# Patient Record
Sex: Female | Born: 1970
Health system: Southern US, Community
[De-identification: ages and names within clinical notes are randomized; demographics above are authoritative.]

## PROBLEM LIST (undated history)

## (undated) DIAGNOSIS — Z5189 Encounter for other specified aftercare: Secondary | ICD-10-CM

## (undated) HISTORY — PX: OTHER SURGICAL HISTORY: SHX169

## (undated) HISTORY — DX: Encounter for other specified aftercare: Z51.89

---

## 1999-03-15 ENCOUNTER — Emergency Department (HOSPITAL_COMMUNITY): Admission: EM | Admit: 1999-03-15 | Discharge: 1999-03-15 | Payer: Self-pay | Admitting: Emergency Medicine

## 2002-10-14 ENCOUNTER — Emergency Department (HOSPITAL_COMMUNITY): Admission: EM | Admit: 2002-10-14 | Discharge: 2002-10-14 | Payer: Self-pay | Admitting: Emergency Medicine

## 2005-03-10 HISTORY — PX: LEEP: SHX91

## 2005-08-12 ENCOUNTER — Ambulatory Visit (HOSPITAL_COMMUNITY): Admission: RE | Admit: 2005-08-12 | Discharge: 2005-08-12 | Payer: Self-pay | Admitting: Obstetrics

## 2008-09-29 ENCOUNTER — Ambulatory Visit (HOSPITAL_COMMUNITY): Admission: RE | Admit: 2008-09-29 | Discharge: 2008-09-29 | Payer: Self-pay | Admitting: Obstetrics

## 2008-09-29 ENCOUNTER — Encounter: Payer: Self-pay | Admitting: Obstetrics

## 2009-10-15 ENCOUNTER — Emergency Department (HOSPITAL_COMMUNITY): Admission: EM | Admit: 2009-10-15 | Discharge: 2009-10-15 | Payer: Self-pay | Admitting: Family Medicine

## 2010-06-16 LAB — CBC
HCT: 39.9 % (ref 36.0–46.0)
Hemoglobin: 13.7 g/dL (ref 12.0–15.0)
MCHC: 34.3 g/dL (ref 30.0–36.0)
MCV: 89.5 fL (ref 78.0–100.0)
Platelets: 312 10*3/uL (ref 150–400)
RBC: 4.46 MIL/uL (ref 3.87–5.11)
RDW: 13.2 % (ref 11.5–15.5)
WBC: 7.6 10*3/uL (ref 4.0–10.5)

## 2010-06-16 LAB — PREGNANCY, URINE: Preg Test, Ur: NEGATIVE

## 2010-07-23 NOTE — Op Note (Signed)
NAMEMARRISSA, [name]                ACCOUNT NO.:  [id number]   MEDICAL RECORD NO.:  [id number]          PATIENT TYPE:  AMB   LOCATION:  SDC                           FACILITY:  [facility]   PHYSICIAN:  Charles A. Clearance Coots, M.D.DATE OF BIRTH:  1970-05-22   DATE OF PROCEDURE:  09/29/2008  DATE OF DISCHARGE:                               OPERATIVE REPORT   PREOPERATIVE DIAGNOSIS:  Cervical intraepithelial neoplasia-II.   POSTOPERATIVE DIAGNOSIS:  Cervical intraepithelial neoplasia-II.   PROCEDURE:  Loop electrosurgical excision procedure.   SURGEON:  Charles A. Clearance Coots, MD   ANESTHESIA:  General with local, Xylocaine and epinephrine.   ESTIMATED BLOOD LOSS:  Minimal.   COMPLICATIONS:  None.   SPECIMEN:  Cervical cone.   DISPOSITION OF SPECIMEN:  Pathology.   OPERATION:  The patient was brought to the operating room and after  satisfactory general endotracheal anesthesia, the legs were brought up  in stirrups and the vagina was prepped and draped in the usual sterile  fashion.  A sterile-coated speculum was inserted in the vaginal vault  and the cervix was isolated.  The local of 20 mL of mixture of 1%  Xylocaine with epinephrine was injected throughout the cervical stroma.  A conization specimen was then obtained with 20 x 12-mm loop in routine  fashion without complications and a wattage of bland of 75 cutting with  50 watts of coag.  The specimen was submitted to Pathology for  evaluation.  The edges of the base of the conization were feathered with  the ball-tip electrode and the base was also cauterized for hemostasis.  There was no active bleeding at the conclusion of the procedure.  All  instruments were retired.  The patient tolerated the procedure well, was  transported to the recovery room in satisfactory condition.       Charles A. Clearance Coots, M.D.  Electronically Signed     CAH/MEDQ  D:  09/29/2008  T:  09/29/2008  Job:  [id number]

## 2015-03-26 ENCOUNTER — Encounter (HOSPITAL_COMMUNITY): Payer: Self-pay | Admitting: Emergency Medicine

## 2015-03-26 ENCOUNTER — Inpatient Hospital Stay (HOSPITAL_COMMUNITY)
Admission: EM | Admit: 2015-03-26 | Discharge: 2015-03-30 | DRG: 871 | Disposition: A | Discharge status: Home / Self Care | Payer: 59 | Attending: Internal Medicine | Admitting: Internal Medicine

## 2015-03-26 ENCOUNTER — Emergency Department (HOSPITAL_COMMUNITY): Payer: 59

## 2015-03-26 DIAGNOSIS — E872 Acidosis: Secondary | ICD-10-CM | POA: Diagnosis present

## 2015-03-26 DIAGNOSIS — J181 Lobar pneumonia, unspecified organism: Secondary | ICD-10-CM

## 2015-03-26 DIAGNOSIS — J13 Pneumonia due to Streptococcus pneumoniae: Secondary | ICD-10-CM | POA: Diagnosis present

## 2015-03-26 DIAGNOSIS — J189 Pneumonia, unspecified organism: Secondary | ICD-10-CM

## 2015-03-26 DIAGNOSIS — F17210 Nicotine dependence, cigarettes, uncomplicated: Secondary | ICD-10-CM | POA: Diagnosis present

## 2015-03-26 DIAGNOSIS — N179 Acute kidney failure, unspecified: Secondary | ICD-10-CM | POA: Diagnosis present

## 2015-03-26 DIAGNOSIS — E871 Hypo-osmolality and hyponatremia: Secondary | ICD-10-CM | POA: Diagnosis present

## 2015-03-26 DIAGNOSIS — J154 Pneumonia due to other streptococci: Secondary | ICD-10-CM | POA: Diagnosis present

## 2015-03-26 DIAGNOSIS — R0602 Shortness of breath: Secondary | ICD-10-CM | POA: Diagnosis present

## 2015-03-26 DIAGNOSIS — E860 Dehydration: Secondary | ICD-10-CM | POA: Diagnosis present

## 2015-03-26 DIAGNOSIS — A419 Sepsis, unspecified organism: Secondary | ICD-10-CM | POA: Diagnosis not present

## 2015-03-26 DIAGNOSIS — A403 Sepsis due to Streptococcus pneumoniae: Secondary | ICD-10-CM | POA: Diagnosis present

## 2015-03-26 DIAGNOSIS — D509 Iron deficiency anemia, unspecified: Secondary | ICD-10-CM | POA: Diagnosis present

## 2015-03-26 LAB — COMPREHENSIVE METABOLIC PANEL
ALBUMIN: 3.4 g/dL — AB (ref 3.5–5.0)
ALK PHOS: 62 U/L (ref 38–126)
ALT: 19 U/L (ref 14–54)
AST: 36 U/L (ref 15–41)
Anion gap: 14 (ref 5–15)
BILIRUBIN TOTAL: 0.9 mg/dL (ref 0.3–1.2)
BUN: 14 mg/dL (ref 6–20)
CALCIUM: 8.5 mg/dL — AB (ref 8.9–10.3)
CO2: 21 mmol/L — ABNORMAL LOW (ref 22–32)
Chloride: 96 mmol/L — ABNORMAL LOW (ref 101–111)
Creatinine, Ser: 1.22 mg/dL — ABNORMAL HIGH (ref 0.44–1.00)
GFR calc Af Amer: >60 mL/min (ref >60)
GFR calc non Af Amer: 53 mL/min — ABNORMAL LOW (ref >60)
GLUCOSE: 177 mg/dL — AB (ref 65–99)
Potassium: 3.9 mmol/L (ref 3.5–5.1)
Sodium: 131 mmol/L — ABNORMAL LOW (ref 135–145)
TOTAL PROTEIN: 8 g/dL (ref 6.5–8.1)

## 2015-03-26 LAB — URINALYSIS, ROUTINE W REFLEX MICROSCOPIC
GLUCOSE, UA: NEGATIVE mg/dL
KETONES UR: NEGATIVE mg/dL
Leukocytes, UA: NEGATIVE
Nitrite: NEGATIVE
Specific Gravity, Urine: 1.024 (ref 1.005–1.030)
pH: 6 (ref 5.0–8.0)

## 2015-03-26 LAB — I-STAT TROPONIN, ED: TROPONIN I, POC: 0.03 ng/mL (ref 0.00–0.08)

## 2015-03-26 LAB — CBC WITH DIFFERENTIAL/PLATELET
BASOS ABS: 0 10*3/uL (ref 0.0–0.1)
Basophils Relative: 0 %
EOS PCT: 0 %
Eosinophils Absolute: 0 10*3/uL (ref 0.0–0.7)
HCT: 30.8 % — ABNORMAL LOW (ref 36.0–46.0)
Hemoglobin: 9.6 g/dL — ABNORMAL LOW (ref 12.0–15.0)
LYMPHS PCT: 8 %
Lymphs Abs: 0.6 10*3/uL — ABNORMAL LOW (ref 0.7–4.0)
MCH: 22.3 pg — ABNORMAL LOW (ref 26.0–34.0)
MCHC: 31.2 g/dL (ref 30.0–36.0)
MCV: 71.6 fL — AB (ref 78.0–100.0)
MONO ABS: 0.4 10*3/uL (ref 0.1–1.0)
Monocytes Relative: 6 %
Neutro Abs: 6 10*3/uL (ref 1.7–7.7)
Neutrophils Relative %: 86 %
PLATELETS: 364 10*3/uL (ref 150–400)
RBC: 4.3 MIL/uL (ref 3.87–5.11)
RDW: 17.9 % — AB (ref 11.5–15.5)
WBC: 7.1 10*3/uL (ref 4.0–10.5)

## 2015-03-26 LAB — I-STAT CG4 LACTIC ACID, ED
Lactic Acid, Venous: 4.05 mmol/L (ref 0.5–2.0)
Lactic Acid, Venous: 4.44 mmol/L (ref 0.5–2.0)

## 2015-03-26 LAB — URINE MICROSCOPIC-ADD ON

## 2015-03-26 MED ORDER — DEXTROSE 5 % IV SOLN
500.0000 mg | Freq: Once | INTRAVENOUS | Status: AC
  Administered 2015-03-26: 500 mg via INTRAVENOUS
  Filled 2015-03-26: qty 500

## 2015-03-26 MED ORDER — KETOROLAC TROMETHAMINE 15 MG/ML IJ SOLN
15.0000 mg | Freq: Once | INTRAMUSCULAR | Status: AC
  Administered 2015-03-26: 15 mg via INTRAVENOUS
  Filled 2015-03-26: qty 1

## 2015-03-26 MED ORDER — SODIUM CHLORIDE 0.9 % IV BOLUS (SEPSIS)
1000.0000 mL | Freq: Once | INTRAVENOUS | Status: AC
  Administered 2015-03-26: 1000 mL via INTRAVENOUS

## 2015-03-26 MED ORDER — GUAIFENESIN 100 MG/5ML PO SOLN
5.0000 mL | Freq: Once | ORAL | Status: AC
  Administered 2015-03-26: 100 mg via ORAL
  Filled 2015-03-26: qty 5

## 2015-03-26 MED ORDER — LEVALBUTEROL HCL 0.63 MG/3ML IN NEBU
0.6300 mg | INHALATION_SOLUTION | RESPIRATORY_TRACT | Status: DC | PRN
  Administered 2015-03-26: 0.63 mg via RESPIRATORY_TRACT
  Filled 2015-03-26: qty 3

## 2015-03-26 MED ORDER — ACETAMINOPHEN 325 MG PO TABS: 650.0000 mg | ORAL_TABLET | Freq: Once | ORAL | Status: AC

## 2015-03-26 MED ORDER — ACETAMINOPHEN 325 MG PO TABS
650.0000 mg | ORAL_TABLET | Freq: Once | ORAL | Status: AC | PRN
  Administered 2015-03-26: 650 mg via ORAL
  Filled 2015-03-26: qty 2

## 2015-03-26 MED ORDER — SODIUM CHLORIDE 0.9 % IV BOLUS (SEPSIS)
1000.0000 mL | INTRAVENOUS | Status: AC
  Administered 2015-03-26 (×2): 1000 mL via INTRAVENOUS

## 2015-03-26 MED ORDER — DEXTROSE 5 % IV SOLN
1.0000 g | Freq: Once | INTRAVENOUS | Status: AC
  Administered 2015-03-26: 1 g via INTRAVENOUS
  Filled 2015-03-26: qty 10

## 2015-03-26 MED ORDER — SODIUM CHLORIDE 0.9 % IV BOLUS (SEPSIS)
500.0000 mL | INTRAVENOUS | Status: AC
  Administered 2015-03-26: 500 mL via INTRAVENOUS

## 2015-03-26 NOTE — ED Notes (Signed)
Bed: WTR7 Expected date:  Expected time:  Means of arrival:  Comments: LAB 

## 2015-03-26 NOTE — ED Notes (Signed)
Phlebotomy made aware of lab draw.

## 2015-03-26 NOTE — ED Notes (Signed)
Bed: [id number] Expected date:  Expected time:  Means of arrival:  Comments: Tr 6

## 2015-03-26 NOTE — ED Notes (Signed)
Penny AlvineGoldston, MD made aware of blood pressure.

## 2015-03-26 NOTE — Consult Note (Signed)
Name: Penny Alimentnissa L Behler MRN: [id number] DOB: 07/20/1970    ADMISSION DATE:  03/26/2015 CONSULTATION DATE:  03/26/15  REFERRING MD :  Elisabeth Pigeonevine  CHIEF COMPLAINT:  SOB  BRIEF PATIENT DESCRIPTION: 45 y.o. female without any significant PMH, [facility].  PCCM called due to increase in lactate from 4 to 4.4.  SIGNIFICANT EVENTS  01/16 > admitted with CAP.  STUDIES:  CXR 01/16 > bilateral opacities c/w PNA.   HISTORY OF PRESENT ILLNESS:  Penny Schultz is a 45 y.o. female with no significant PMH and who takes no chronic medications.  [facility] with SOB, weakness, fevers/chills, intermittent cough productive of clear sputum.  He had not had any chest pain, N/V/D, abd pain, myalgias, lightheadedness, recent travel, exposures to known sick contacts.  In ED, CXR revealed bilateral opacities concerning for PNA.  She was started on CAP coverage and [facility]  Following admission, lactate had increased from 4 to 4.4; therefore, PCCM was consulted.  She is feeling a little better but remains tachypneic. She continue to have cough.  Her lactic acid has improved.  PAST MEDICAL HISTORY :   has no past medical history on file.  has past surgical history that includes open heart surgery 45 years old. Prior to Admission medications   Medication Sig Start Date End Date Taking? Authorizing Provider  Acetaminophen-Guaifenesin (THERAFLU FLU/CHEST CONGESTION) 1000-400 MG PACK Take 1 packet by mouth once.   Yes Historical Provider, MD  Aspirin-Salicylamide-Caffeine (BC HEADACHE PO) Take 2 packets by mouth every 3 (three) hours as needed (for pain).   Yes Historical Provider, MD  Phenyleph-Doxylamine-DM-APAP (ALKA SELTZER PLUS PO) Take 2 tablets by mouth every 4 (four) hours as needed (for cold).   Yes Historical Provider, MD   No Known Allergies  FAMILY HISTORY:  family history is not on file. SOCIAL HISTORY:  reports that she has been smoking Cigarettes.  She  has been smoking about 1.00 pack per day. She does not have any smokeless tobacco history on file. She reports that she uses illicit drugs (Marijuana). She reports that she does not drink alcohol.  REVIEW OF SYSTEMS:   Gen: + fever, + chills, no weight change, fatigue, night sweats HEENT: Denies blurred vision, double vision, hearing loss, tinnitus, sinus congestion, rhinorrhea, sore throat, neck stiffness, dysphagia PULM: per HPI CV: Denies chest pain, edema, orthopnea, paroxysmal nocturnal dyspnea, palpitations GI: Denies abdominal pain, nausea, vomiting, diarrhea, hematochezia, melena, constipation, change in bowel habits GU: Denies dysuria, hematuria, polyuria, oliguria, urethral discharge Endocrine: Denies hot or cold intolerance, polyuria, polyphagia or appetite change Derm: Denies rash, dry skin, scaling or peeling skin change Heme: Denies easy bruising, bleeding, bleeding gums Neuro: Denies headache, numbness, weakness, slurred speech, loss of memory or consciousness    SUBJECTIVE:   VITAL SIGNS: Temp:  [99 F (37.2 C)-103.2 F (39.6 C)] 99.6 F (37.6 C) (01/16 1919) Pulse Rate:  [100-150] 100 (01/16 2100) Resp:  [19-35] 35 (01/16 2100) BP: (90-144)/(63-87) 102/82 mmHg (01/16 2100) SpO2:  [93 %-98 %] 95 % (01/16 2100) Weight:  [73.483 kg (162 lb)] 73.483 kg (162 lb) (01/16 1325)  PHYSICAL EXAMINATION: General: Tachypneic, talking in full sentences Neuro: Awake, alert, no distress HEENT: NCAT, OP clear  Cardiovascular: Tachy, regular, no mgr, radial pulses intact Lungs: Crackles R lung bases and mid lung fields, clear on left Abdomen: BS+, soft, nontender Musculoskeletal: Normal bulk and tone Skin: warm, well perfused, no mottling, normal cap refill  Recent Labs Lab 03/26/15 1346  NA 131*  K 3.9  CL 96*  CO2 21*  BUN 14  CREATININE 1.22*  GLUCOSE 177*    Recent Labs Lab 03/26/15 1346  HGB 9.6*  HCT 30.8*  WBC 7.1  PLT 364   Dg Chest 2  View  03/26/2015  CLINICAL DATA:  Productive cough, chest pain and fever for 1 week. Initial encounter. EXAM: CHEST  2 VIEW COMPARISON:  None. FINDINGS: Extensive airspace opacity is present in the right upper, right middle and left lower lobes. Heart size is normal. No pneumothorax or pleural effusion. IMPRESSION: Bilateral airspace disease most consistent with pneumonia. Recommend plain film follow-up to clearing. Electronically Signed   By: Drusilla Kanner M.D.   On: 03/26/2015 13:52    ASSESSMENT / PLAN:  Sepsis - presumed due to CAP;  qSOFA score 1. Lactic acid now clearing which is reassuring.  Has been given ceftriaxone and azithromycin which is appropriate. Plan: Continue empiric abx Follow cultures and PCT algorithm Continue BD's Pulmonary hygiene Will need repeat CXR to ensure resolution CXR in AM  AKI Mild hyponatremia Plan: Continue gentle hydration BMP in AM  Dropping lactic acid is reassuring and the fact that she feels better is reassuring, however while she may no longer need ICU level care I would still work to admit her to at least telemetry for closer monitoring.  PCCM will sign off  Heber Hale, MD [place] PCCM Pager: [phone] Cell: [phone] After 3pm or if no response, call [phone]

## 2015-03-26 NOTE — ED Notes (Signed)
Pt reports cough/chest pain for a week; febrile on arrival.

## 2015-03-26 NOTE — ED Notes (Signed)
Consulting civil engineerCharge RN working with [facility] [employer] for type of bed needed.

## 2015-03-26 NOTE — ED Provider Notes (Signed)
CSN: [id number]     Arrival date & time 03/26/15  1318 History   First MD Initiated Contact with Patient 03/26/15 1410     Chief Complaint  Patient presents with  . Fever  . Shortness of Breath     (Consider location/radiation/quality/duration/timing/severity/associated sxs/prior Treatment) HPI   Penny Schultz is a 45 y.o. female was been ill for several days with fever, chills, cough, achiness and feeling weak. She denies headache, back pain, dizziness, nausea, vomiting, dysuria, diarrhea or constipation. There are no other known modifying factors.   History reviewed. No pertinent past medical history. Past Surgical History  Procedure Laterality Date  . Open heart surgery 45 years old     No family history on file. Social History  Substance Use Topics  . Smoking status: Current Every Day Smoker -- 1.00 packs/day    Types: Cigarettes  . Smokeless tobacco: None  . Alcohol Use: No   OB History    No data available     Review of Systems  All other systems reviewed and are negative.     Allergies  Review of patient's allergies indicates no known allergies.  Home Medications   Prior to Admission medications   Medication Sig Start Date End Date Taking? Authorizing Provider  Acetaminophen-Guaifenesin (THERAFLU FLU/CHEST CONGESTION) 1000-400 MG PACK Take 1 packet by mouth once.   Yes Historical Provider, MD  Aspirin-Salicylamide-Caffeine (BC HEADACHE PO) Take 2 packets by mouth every 3 (three) hours as needed (for pain).   Yes Historical Provider, MD  Phenyleph-Doxylamine-DM-APAP (ALKA SELTZER PLUS PO) Take 2 tablets by mouth every 4 (four) hours as needed (for cold).   Yes Historical Provider, MD   BP 108/70 mmHg  Pulse 108  Temp(Src) 99.8 F (37.7 C) (Oral)  Resp 23  Ht 5\' 8"  (1.727 m)  Wt 162 lb (73.483 kg)  BMI 24.64 kg/m2  SpO2 95%  LMP 03/21/2015 Physical Exam  Constitutional: She is oriented to person, place, and time. She appears well-developed and  well-nourished. No distress.  HENT:  Head: Normocephalic and atraumatic.  Right Ear: External ear normal.  Left Ear: External ear normal.  Eyes: Conjunctivae and EOM are normal. Pupils are equal, round, and reactive to light.  Neck: Normal range of motion and phonation normal. Neck supple.  Cardiovascular: Normal rate, regular rhythm and normal heart sounds.   Capillary refill less than 2 seconds, skin turgor normal.  Pulmonary/Chest: Effort normal. No respiratory distress. She has no wheezes. She exhibits no tenderness and no bony tenderness.  Few scattered rhonchi  Abdominal: Soft. There is no tenderness.  Musculoskeletal: Normal range of motion.  Neurological: She is alert and oriented to person, place, and time. No cranial nerve deficit or sensory deficit. She exhibits normal muscle tone. Coordination normal.  Skin: Skin is warm, dry and intact.  Psychiatric: She has a normal mood and affect. Her behavior is normal. Judgment and thought content normal.  Nursing note and vitals reviewed.   ED Course  Procedures (including critical care time)  Medications  sodium chloride 0.9 % bolus 1,000 mL (1,000 mLs Intravenous New Bag/Given 03/26/15 1418)    Followed by  sodium chloride 0.9 % bolus 500 mL (not administered)  azithromycin (ZITHROMAX) 500 mg in dextrose 5 % 250 mL IVPB (500 mg Intravenous New Bag/Given 03/26/15 1536)  acetaminophen (TYLENOL) tablet 650 mg (650 mg Oral Given 03/26/15 1336)  acetaminophen (TYLENOL) tablet 650 mg (0 mg Oral Duplicate 03/26/15 1512)  cefTRIAXone (ROCEPHIN) 1 g in dextrose 5 %  50 mL IVPB (1 g Intravenous New Bag/Given 03/26/15 1445)    Patient Vitals for the past 24 hrs:  BP Temp Temp src Pulse Resp SpO2 Height Weight  03/26/15 1531 108/70 mmHg 99.8 F (37.7 C) Oral 108 23 95 % - -  03/26/15 1500 106/63 mmHg - - 111 23 95 % - -  03/26/15 1415 117/67 mmHg 102.6 F (39.2 C) Oral (!) 133 20 98 % - -  03/26/15 1325 110/87 mmHg (!) 103.2 F (39.6 C)  Oral (!) 150 24 94 % 5\' 8"  (1.727 m) 162 lb (73.483 kg)    4:10 PM Reevaluation with update and discussion. After initial assessment and treatment, an updated evaluation reveals she is comfortable now. She denies headache or weakness at this time. Lynix Bonine L   15:44- consultation requested for intensivist, query admit. Dr. Vassie LollAlva recommends admit to Hospitalist and consult with them prn. 16:25   Care to Dr. Criss AlvineGoldston to disposition after 2nd Lactate  CRITICAL CARE Performed by: Mancel BaleWENTZ,Lakendria Nicastro L Total critical care time: 35  minutes Critical care time was exclusive of separately billable procedures and treating other patients. Critical care was necessary to treat or prevent imminent or life-threatening deterioration. Critical care was time spent personally by me on the following activities: development of treatment plan with patient and/or surrogate as well as nursing, discussions with consultants, evaluation of patient's response to treatment, examination of patient, obtaining history from patient or surrogate, ordering and performing treatments and interventions, ordering and review of laboratory studies, ordering and review of radiographic studies, pulse oximetry and re-evaluation of patient's condition.    Labs Review Labs Reviewed  COMPREHENSIVE METABOLIC PANEL - Abnormal; Notable for the following:    Sodium 131 (*)    Chloride 96 (*)    CO2 21 (*)    Glucose, Bld 177 (*)    Creatinine, Ser 1.22 (*)    Calcium 8.5 (*)    Albumin 3.4 (*)    GFR calc non Af Amer 53 (*)    All other components within normal limits  URINALYSIS, ROUTINE W REFLEX MICROSCOPIC (NOT AT [facility]) - Abnormal; Notable for the following:    Color, Urine AMBER (*)    APPearance CLOUDY (*)    Hgb urine dipstick LARGE (*)    Bilirubin Urine SMALL (*)    Protein, ur >300 (*)    All other components within normal limits  CBC WITH DIFFERENTIAL/PLATELET - Abnormal; Notable for the following:    Hemoglobin 9.6  (*)    HCT 30.8 (*)    MCV 71.6 (*)    MCH 22.3 (*)    RDW 17.9 (*)    Lymphs Abs 0.6 (*)    All other components within normal limits  URINE MICROSCOPIC-ADD ON - Abnormal; Notable for the following:    Squamous Epithelial / LPF 6-30 (*)    Bacteria, UA RARE (*)    All other components within normal limits  I-STAT CG4 LACTIC ACID, ED - Abnormal; Notable for the following:    Lactic Acid, Venous 4.05 (*)    All other components within normal limits  CULTURE, BLOOD (ROUTINE X 2)  CULTURE, BLOOD (ROUTINE X 2)  URINE CULTURE  I-STAT TROPOININ, ED  I-STAT CG4 LACTIC ACID, ED    Imaging Review Dg Chest 2 View  03/26/2015  CLINICAL DATA:  Productive cough, chest pain and fever for 1 week. Initial encounter. EXAM: CHEST  2 VIEW COMPARISON:  None. FINDINGS: Extensive airspace opacity is present in the right  upper, right middle and left lower lobes. Heart size is normal. No pneumothorax or pleural effusion. IMPRESSION: Bilateral airspace disease most consistent with pneumonia. Recommend plain film follow-up to clearing. Electronically Signed   By: Drusilla Kanner M.D.   On: 03/26/2015 13:52   I have personally reviewed and evaluated these images and lab results as part of my medical decision-making.   EKG Interpretation   Date/Time:  Monday March 26 2015 13:32:18 EST Ventricular Rate:  149 PR Interval:  108 QRS Duration: 67 QT Interval:  260 QTC Calculation: 409 R Axis:   36 Text Interpretation:  Sinus tachycardia Borderline ST depression, diffuse  leads Baseline wander in lead(s) II III aVF V3 V4 No old tracing to  compare Confirmed by [facility]  MD, Keion Neels [phone]) on 03/26/2015 4:03:13 PM      MDM   Final diagnoses:  Lobar pneumonia (HCC)     Community-acquired pneumonia, without clinical or vital sign abnormalities consistent with severe sepsis. Initial lactate high at 4. Incidental hyperglycemia, and likely mild dehydration. Patient has been treated with community-acquired  pneumonia. Empiric therapy, and high value saline bolus, 30 cc/kg.  Nursing Notes Reviewed/ Care Coordinated, and agree without changes. Applicable Imaging Reviewed.  Interpretation of Laboratory Data incorporated into ED treatment  Plan: Admit   Mancel Bale, MD 03/27/15 0710

## 2015-03-26 NOTE — Progress Notes (Signed)
[facility] ED CM noted pt with coverage but no pcp listed Spoke with pt who confirms no pcp  [facility] ED CM spoke with pt on how to obtain an in network pcp with insurance coverage via the customer service number or web site  Cm reviewed ED level of care for crisis/emergent services and community pcp level of care to manage continuous or chronic medical concerns.  The pt voiced understanding CM encouraged pt and discussed pt's responsibility to verify with pt's insurance carrier that any recommended medical provider offered by any emergency room or a hospital provider is within the carrier's network. The pt voiced understanding      

## 2015-03-26 NOTE — ED Notes (Signed)
Pt transferred to DG at present time.

## 2015-03-26 NOTE — ED Notes (Signed)
Phebotomy made aware of blood draw. Patient has fluid bolus running in IV; open all the way.

## 2015-03-26 NOTE — ED Notes (Signed)
Attempted blood draw; unsuccessful. Phlebotomy made aware.

## 2015-03-26 NOTE — ED Provider Notes (Signed)
PA for [facility] is requesting patient be changed to telemetry. Will update bed request.  Pricilla LovelessScott Candice Lunney, MD 03/26/15 2222

## 2015-03-26 NOTE — H&P (Signed)
[facility] History and Physical  Penny Schultz JJK:[id number] DOB: 18-May-1970 DOA: 03/26/2015  Referring physician: ER physician: Dr. Christ Kick PCP: No primary care provider on file. will set up with [facility] on discharge   Chief Complaint: "not feeling well"  HPI:  45 y.o. female with no significant past medical history, does not take any meds who presented to [facility] ED with reports of worsening shortness of breath, weakness, fevers, chills, intermittent productive cough of clear sputum. No reports of chest tightness. Shortness of breath is present at rest and with exertion. No abdominal pain, no nausea or vomiting. No diarrhea or constipation. No fall. No lightheadedness.   In ED, pt was hemodynamically stable. BP was 90/63, RR 19-25, HR 106-150, T max 103.2 F, oxygen saturation 94% with [place] oxygen support. Blood work showed hemoglobin of 9.6, creatinine 1.22, lactic acid 4.05. CXR showed bilateral airspace opacities likely pneumonia. She was started on azithro and rocephin. She was admitted to SDU.   Assessment & Plan    Principal Problem:   Sepsis due to pneumonia ([place]) - Sepsis criteria met on admission with fever, tachycardia, tachypnea, lactic acidosis - Suspected source of infection is bilateral pneumonia - Sepsis and pneumonia order set placed - Staretd on azithromycin and rocephin  - Follow up blood cultures, resp culture, legionella, strep pneumonia - Follow up repeat lactic acid level, procalcitonin - Admission to SDU  Active Problems:   Acute renal failure (ARF) ([place]) - Due to sepsis, dehydration - Continue IV fluids - Follow up BMP in am    Microcytic anemia - Check iron panel, ferritin   DVT prophylaxis:  - SCD's bilaterally   Radiological Exams on Admission: Dg Chest 2 View 03/26/2015  Bilateral airspace disease most consistent with pneumonia. Recommend plain film follow-up to clearing. Electronically Signed   By: Inge Rise M.D.   On: 03/26/2015 13:52     EKG: I have personally reviewed EKG. EKG shows sinus tachycardia   Code Status: Full Family Communication: Plan of care discussed with the patient  Disposition Plan: Admit for further evaluation  Leisa Lenz, MD  [facility] Pager [phone]  Time spent in minutes: 75 minutes  Review of Systems:  Constitutional: Positive for fever, chills and malaise/fatigue. Positive for diaphoresis.  HENT: Negative for hearing loss, ear pain, nosebleeds, congestion, sore throat, neck pain, tinnitus and ear discharge.   Eyes: Negative for blurred vision, double vision, photophobia, pain, discharge and redness.  Respiratory: per HPI.   Cardiovascular: Negative for chest pain, palpitations, orthopnea, claudication and leg swelling.  Gastrointestinal: Negative for nausea, vomiting and abdominal pain. Negative for heartburn, constipation, blood in stool and melena.  Genitourinary: Negative for dysuria, urgency, frequency, hematuria and flank pain.  Musculoskeletal: Negative for myalgias, back pain, joint pain and falls.  Skin: Negative for itching and rash.  Neurological: Negative for dizziness and weakness. Negative for tingling, tremors, sensory change, speech change, focal weakness, loss of consciousness and headaches.  Endo/Heme/Allergies: Negative for environmental allergies and polydipsia. Does not bruise/bleed easily.  Psychiatric/Behavioral: Negative for suicidal ideas. The patient is not nervous/anxious.      History reviewed. No pertinent past medical history. Past Surgical History  Procedure Laterality Date  . Open heart surgery 45 years old     Social History:  reports that she has been smoking Cigarettes.  She has been smoking about 1.00 pack per day. She does not have any smokeless tobacco history on file. She reports that she uses illicit drugs (Marijuana). She reports  that she does not drink alcohol.  No Known Allergies  Family History: hypertension in mother    Prior to  Admission medications   Medication Sig Start Date End Date Taking? Authorizing Provider  Acetaminophen-Guaifenesin (THERAFLU FLU/CHEST CONGESTION) 1000-400 MG PACK Take 1 packet by mouth once.   Yes Historical Provider, MD  Aspirin-Salicylamide-Caffeine (BC HEADACHE PO) Take 2 packets by mouth every 3 (three) hours as needed (for pain).   Yes Historical Provider, MD  Phenyleph-Doxylamine-DM-APAP (ALKA SELTZER PLUS PO) Take 2 tablets by mouth every 4 (four) hours as needed (for cold).   Yes Historical Provider, MD   Physical Exam: Filed Vitals:   03/26/15 1721 03/26/15 1752 03/26/15 1801 03/26/15 1804  BP: 144/73 90/63 125/65 110/69  Pulse: 123 109    Temp:      TempSrc:      Resp: 23 25    Height:      Weight:      SpO2: 95% 96%      Physical Exam  Constitutional: Appears well-developed and well-nourished. No distress.  HENT: Normocephalic. No tonsillar erythema or exudates Eyes: Conjunctivae are normal. No scleral icterus.  Neck: Normal ROM. Neck supple. No JVD. No tracheal deviation. No thyromegaly.  CVS: RRR, S1/S2 +, no murmurs, no gallops, no carotid bruit.  Pulmonary: diminished breath sounds, no wheezing   Abdominal: Soft. BS +,  no distension, tenderness, rebound or guarding.  Musculoskeletal: Normal range of motion. No edema and no tenderness.  Lymphadenopathy: No lymphadenopathy noted, cervical, inguinal. Neuro: Alert. Normal reflexes, muscle tone coordination. No focal neurologic deficits. Skin: Skin is warm and dry. No rash noted.  No erythema. No pallor.  Psychiatric: Normal mood and affect. Behavior, judgment, thought content normal.   Labs on Admission:  Basic Metabolic Panel:  Recent Labs Lab 03/26/15 1346  NA 131*  K 3.9  CL 96*  CO2 21*  GLUCOSE 177*  BUN 14  CREATININE 1.22*  CALCIUM 8.5*   Liver Function Tests:  Recent Labs Lab 03/26/15 1346  AST 36  ALT 19  ALKPHOS 62  BILITOT 0.9  PROT 8.0  ALBUMIN 3.4*   No results for input(s):  LIPASE, AMYLASE in the last 168 hours. No results for input(s): AMMONIA in the last 168 hours. CBC:  Recent Labs Lab 03/26/15 1346  WBC 7.1  NEUTROABS 6.0  HGB 9.6*  HCT 30.8*  MCV 71.6*  PLT 364   Cardiac Enzymes: No results for input(s): CKTOTAL, CKMB, CKMBINDEX, TROPONINI in the last 168 hours. BNP: Invalid input(s): POCBNP CBG: No results for input(s): GLUCAP in the last 168 hours.  If 7PM-7AM, please contact night-coverage [email or web address] Password [email or web address] 03/26/2015, 6:31 PM

## 2015-03-26 NOTE — Progress Notes (Signed)
PHARMACY NOTE -  Renal dose adjustment  Pharmacy has been assisting with dosing of Rocephin & Zithromax for CAP. Dosage remains stable at Rocephin 1gm IV q24h and Zithromax 500mg  IV q24h and need for further dosage adjustment appears unlikely at present.    Will sign off at this time.  Please reconsult if a change in clinical status warrants re-evaluation of dosage.   Junita PushMichelle Barnett Elzey, PharmD, BCPS Pager: [phone] 03/26/2015@8 :35 PM

## 2015-03-26 NOTE — ED Provider Notes (Addendum)
Discussed with Dr. Elisabeth Pigeonevine. Patient to be admitted for sepsis/CAP. Lactate is currently being redrawn right now. Patient just ambulated back from the bathroom, current vital signs show a tachycardia with heart rate of 120 and a blood pressure of 144/73. Last couple blood pressures have been in the high 90s but now adequate. No respiratory distress. Dr. Elisabeth Pigeonevine requests stepdown admission. Orders placed.  Pricilla LovelessScott Samir Ishaq, MD 03/26/15 1729  Repeat Lactate 4.4, up from 4. D/w ICU, Dr. Vassie LollAlva, who will send someone to consult but given BP adequate now, no respiratory distress/failure, still recommends hospitalist admit. Another liter of fluid will be given.  Pricilla LovelessScott Oland Arquette, MD 03/26/15 [phone]

## 2015-03-26 NOTE — ED Notes (Signed)
Elevated I stat lactic -  No EDP has signed up yet, but both were informed of result.

## 2015-03-26 NOTE — ED Notes (Signed)
Lowell GuitarJen O, Charge RN made aware of delay in lab draw. Candise BowensJen going to attempt bloood draw.

## 2015-03-27 DIAGNOSIS — J154 Pneumonia due to other streptococci: Secondary | ICD-10-CM | POA: Diagnosis present

## 2015-03-27 DIAGNOSIS — A403 Sepsis due to Streptococcus pneumoniae: Secondary | ICD-10-CM | POA: Diagnosis present

## 2015-03-27 LAB — LEGIONELLA ANTIGEN, URINE

## 2015-03-27 LAB — COMPREHENSIVE METABOLIC PANEL
ALBUMIN: 2.5 g/dL — AB (ref 3.5–5.0)
ALK PHOS: 50 U/L (ref 38–126)
ALT: 16 U/L (ref 14–54)
ANION GAP: 10 (ref 5–15)
AST: 23 U/L (ref 15–41)
BILIRUBIN TOTAL: 0.7 mg/dL (ref 0.3–1.2)
BUN: 14 mg/dL (ref 6–20)
CALCIUM: 7.3 mg/dL — AB (ref 8.9–10.3)
CO2: 21 mmol/L — ABNORMAL LOW (ref 22–32)
Chloride: 106 mmol/L (ref 101–111)
Creatinine, Ser: 0.78 mg/dL (ref 0.44–1.00)
GFR calc non Af Amer: >60 mL/min (ref >60)
GLUCOSE: 106 mg/dL — AB (ref 65–99)
POTASSIUM: 3.8 mmol/L (ref 3.5–5.1)
SODIUM: 137 mmol/L (ref 135–145)
TOTAL PROTEIN: 6 g/dL — AB (ref 6.5–8.1)

## 2015-03-27 LAB — EXPECTORATED SPUTUM ASSESSMENT W REFEX TO RESP CULTURE

## 2015-03-27 LAB — EXPECTORATED SPUTUM ASSESSMENT W GRAM STAIN, RFLX TO RESP C

## 2015-03-27 LAB — IRON AND TIBC
Iron: 10 ug/dL — ABNORMAL LOW (ref 28–170)
Saturation Ratios: 3 % — ABNORMAL LOW (ref 10.4–31.8)
TIBC: 297 ug/dL (ref 250–450)
UIBC: 287 ug/dL

## 2015-03-27 LAB — STREP PNEUMONIAE URINARY ANTIGEN
STREP PNEUMO URINARY ANTIGEN: POSITIVE — AB
Strep Pneumo Urinary Antigen: POSITIVE — AB

## 2015-03-27 LAB — CBC
HEMATOCRIT: 24.4 % — AB (ref 36.0–46.0)
HEMATOCRIT: 25.3 % — AB (ref 36.0–46.0)
HEMOGLOBIN: 7.4 g/dL — AB (ref 12.0–15.0)
HEMOGLOBIN: 7.7 g/dL — AB (ref 12.0–15.0)
MCH: 22.1 pg — ABNORMAL LOW (ref 26.0–34.0)
MCH: 22.1 pg — ABNORMAL LOW (ref 26.0–34.0)
MCHC: 30.3 g/dL (ref 30.0–36.0)
MCHC: 30.4 g/dL (ref 30.0–36.0)
MCV: 72.8 fL — ABNORMAL LOW (ref 78.0–100.0)
MCV: 72.5 fL — AB (ref 78.0–100.0)
Platelets: 302 10*3/uL (ref 150–400)
Platelets: 361 10*3/uL (ref 150–400)
RBC: 3.35 MIL/uL — AB (ref 3.87–5.11)
RBC: 3.49 MIL/uL — AB (ref 3.87–5.11)
RDW: 18.2 % — ABNORMAL HIGH (ref 11.5–15.5)
RDW: 18.2 % — AB (ref 11.5–15.5)
WBC: 9.6 10*3/uL (ref 4.0–10.5)
WBC: 11.2 10*3/uL — AB (ref 4.0–10.5)

## 2015-03-27 LAB — ABO/RH: ABO/RH(D): B POS

## 2015-03-27 LAB — PREPARE RBC (CROSSMATCH)

## 2015-03-27 LAB — FERRITIN: FERRITIN: 82 ng/mL (ref 11–307)

## 2015-03-27 LAB — LACTIC ACID, PLASMA: Lactic Acid, Venous: 2.1 mmol/L (ref 0.5–2.0)

## 2015-03-27 MED ORDER — SODIUM CHLORIDE 0.9 % IV SOLN
INTRAVENOUS | Status: AC
  Administered 2015-03-27: 02:00:00 via INTRAVENOUS

## 2015-03-27 MED ORDER — CEFTRIAXONE SODIUM 1 G IJ SOLR
1.0000 g | INTRAMUSCULAR | Status: DC
Start: 2015-03-27 — End: 2015-03-28
  Administered 2015-03-27: 1 g via INTRAVENOUS
  Filled 2015-03-27 (×2): qty 10

## 2015-03-27 MED ORDER — KETOROLAC TROMETHAMINE 15 MG/ML IJ SOLN
15.0000 mg | Freq: Four times a day (QID) | INTRAMUSCULAR | Status: AC | PRN
  Administered 2015-03-27 – 2015-03-29 (×3): 15 mg via INTRAVENOUS
  Filled 2015-03-27 (×4): qty 1

## 2015-03-27 MED ORDER — DEXTROSE 5 % IV SOLN
500.0000 mg | INTRAVENOUS | Status: DC
  Administered 2015-03-27: 500 mg via INTRAVENOUS
  Filled 2015-03-27 (×2): qty 500

## 2015-03-27 MED ORDER — GUAIFENESIN-DM 100-10 MG/5ML PO SYRP
5.0000 mL | ORAL_SOLUTION | ORAL | Status: DC | PRN
  Administered 2015-03-27 – 2015-03-28 (×5): 5 mL via ORAL
  Filled 2015-03-27 (×5): qty 10

## 2015-03-27 MED ORDER — SODIUM CHLORIDE 0.9 % IV SOLN
Freq: Once | INTRAVENOUS | Status: AC
  Administered 2015-03-27: 15:00:00 via INTRAVENOUS

## 2015-03-27 NOTE — Progress Notes (Signed)
MD notified of Hgb 7.7 at redraw. MD then gave verbal order to transfuse 1 unit PRBCs. Patient asymptomatic at this time.

## 2015-03-27 NOTE — Progress Notes (Signed)
Patient ID: Penny Schultz, female   DOB: 1970-05-22, 45 y.o.   MRN: [id number] [facility] PROGRESS NOTE  RYA RAUSCH S[facility]:[id number] DOB: 1970/07/21 DOA: 03/26/2015 PCP: No primary care provider on file. will set up with community health wellness on discharge. She does not have a primary care physician.  Brief narrative:    45 y.o. female with no significant past medical history, does not take any meds who presented to [facility] ED with reports of worsening shortness of breath, weakness, fevers, chills, intermittent productive cough of clear sputum.   In ED, pt was hemodynamically stable. BP was 90/63, RR 19-25, HR 106-150, T max 103.2 F, oxygen saturation 94% with [place] oxygen support. Blood work showed hemoglobin of 9.6, creatinine 1.22, lactic acid 4.05. CXR showed bilateral airspace opacities likely pneumonia. She was started on azithro and rocephin. Streptococcus antigen was positive on this admission.  Assessment/Plan:    Principal Problem:   Sepsis due to Streptococcus pneumoniae ([place])  - Sepsis criteria met on admission with fever, tachycardia, tachypnea, lactic acidosis - Source of infection is Streptococcus pneumonia. Streptococcus antigen was positive on this admission. Legionella is negative. - Blood culture so far are negative - Continue current antibiotics, azithromycin and Rocephin - Respiratory status remains stable.  Active Problems:   Acute renal failure (ARF) (HCC) - Likely prerenal - Creatinine normalized with IV fluids    Microcytic anemia - Hemoglobin was 9.6 on the admission and this morning drop noted down to 7.4 - Repeat CBC and is still less than 8 will give 1 unit of PRBC - Her iron panel shows low iron but TIBC is within normal limits.   DVT Prophylaxis  - SCD's bilaterally    Code Status: Full.  Family Communication:  plan of care discussed with the patient Disposition Plan: Home likely by 1/19 is she feels better.   IV access:  Peripheral  IV  Procedures and diagnostic studies:    Dg Chest 2 View 03/26/2015 Bilateral airspace disease most consistent with pneumonia. Recommend plain film follow-up to clearing.   Medical Consultants:  PCCM - signed off 1/17  Other Consultants:  None   IAnti-Infectives:   Azithromcyin adn rocephin 03/26/2015 -->   Leisa Lenz, MD  [facility] Pager [phone]  Time spent in minutes: 25 minutes  If 7PM-7AM, please contact night-coverage [email or web address] Password [email or web address] 03/27/2015, 12:26 PM   LOS: 1 day    HPI/Subjective: No acute overnight events. Patient reports she feels better.   Objective: Filed Vitals:   03/26/15 2300 03/26/15 2330 03/27/15 0054 03/27/15 0631  BP: 107/69 105/68 123/69 123/69  Pulse: 96 97 111 104  Temp:   98.4 F (36.9 C) 98.4 F (36.9 C)  TempSrc:   Oral Oral  Resp: '25 23 20 20  ' Height:   '5\' 8"'  (1.727 m)   Weight:   168 lb 14.4 oz (76.613 kg)   SpO2: 93% 94% 95% 97%   No intake or output data in the 24 hours ending 03/27/15 1226  Exam:   General:  Pt is alert, follows commands appropriately, not in acute distress  Cardiovascular: Regular rate and rhythm, S1/S2, no murmurs  Respiratory: diminished with rhonchi on lower lobes   Abdomen: Soft, non tender, non distended, bowel sounds present  Extremities: No edema, pulses DP and PT palpable bilaterally  Neuro: Grossly nonfocal  Data Reviewed: Basic Metabolic Panel:  Recent Labs Lab 03/26/15 1346 03/27/15 0503  NA 131* 137  K 3.9 3.8  CL 96* 106  CO2 21* 21*  GLUCOSE 177* 106*  BUN 14 14  CREATININE 1.22* 0.78  CALCIUM 8.5* 7.3*   Liver Function Tests:  Recent Labs Lab 03/26/15 1346 03/27/15 0503  AST 36 23  ALT 19 16  ALKPHOS 62 50  BILITOT 0.9 0.7  PROT 8.0 6.0*  ALBUMIN 3.4* 2.5*   No results for input(s): LIPASE, AMYLASE in the last 168 hours. No results for input(s): AMMONIA in the last 168 hours. CBC:  Recent Labs Lab 03/26/15 1346 03/27/15 0503  WBC  7.1 9.6  NEUTROABS 6.0  --   HGB 9.6* 7.4*  HCT 30.8* 24.4*  MCV 71.6* 72.8*  PLT 364 302   Cardiac Enzymes: No results for input(s): CKTOTAL, CKMB, CKMBINDEX, TROPONINI in the last 168 hours. BNP: Invalid input(s): POCBNP CBG: No results for input(s): GLUCAP in the last 168 hours.  Recent Results (from the past 240 hour(s))  Culture, blood (routine x 2)     Status: None (Preliminary result)   Collection Time: 03/26/15  1:48 PM  Result Value Ref Range Status   Specimen Description BLOOD LEFT ANTECUBITAL  Final   Special Requests BOTTLES DRAWN AEROBIC AND ANAEROBIC 5ML  Final   Culture   Final    NO GROWTH < 24 HOURS Performed at [facility]    Report Status PENDING  Incomplete  Culture, blood (routine x 2)     Status: None (Preliminary result)   Collection Time: 03/26/15  1:54 PM  Result Value Ref Range Status   Specimen Description BLOOD RIGHT ANTECUBITAL  Final   Special Requests BOTTLES DRAWN AEROBIC AND ANAEROBIC 5ML  Final   Culture   Final    NO GROWTH < 24 HOURS Performed at [facility]    Report Status PENDING  Incomplete  Urine culture     Status: None (Preliminary result)   Collection Time: 03/26/15  2:17 PM  Result Value Ref Range Status   Specimen Description URINE, CLEAN CATCH  Final   Special Requests NONE  Final   Culture   Final    TOO YOUNG TO READ Performed at [facility]    Report Status PENDING  Incomplete  Culture, sputum-assessment     Status: None   Collection Time: 03/27/15  5:17 AM  Result Value Ref Range Status   Specimen Description SPUTUM  Final   Special Requests NONE  Final   Sputum evaluation   Final    MICROSCOPIC FINDINGS SUGGEST THAT THIS SPECIMEN IS NOT REPRESENTATIVE OF LOWER RESPIRATORY SECRETIONS. PLEASE RECOLLECT. NOTIFIED LAND RN AT 0530 ON 1.17.17 BY SHUEA    Report Status 03/27/2015 FINAL  Final     Scheduled Meds: . azithromycin  500 mg Intravenous Q24H  . cefTRIAXone (ROCEPHIN)  IV  1 g  Intravenous Q24H   Continuous Infusions:

## 2015-03-28 DIAGNOSIS — J154 Pneumonia due to other streptococci: Secondary | ICD-10-CM

## 2015-03-28 DIAGNOSIS — D509 Iron deficiency anemia, unspecified: Secondary | ICD-10-CM

## 2015-03-28 DIAGNOSIS — A403 Sepsis due to Streptococcus pneumoniae: Principal | ICD-10-CM

## 2015-03-28 LAB — TYPE AND SCREEN
ABO/RH(D): B POS
Antibody Screen: NEGATIVE
UNIT DIVISION: 0

## 2015-03-28 LAB — EXPECTORATED SPUTUM ASSESSMENT W REFEX TO RESP CULTURE

## 2015-03-28 LAB — CBC
HCT: 30.1 % — ABNORMAL LOW (ref 36.0–46.0)
HEMOGLOBIN: 9.3 g/dL — AB (ref 12.0–15.0)
MCH: 23.1 pg — AB (ref 26.0–34.0)
MCHC: 30.9 g/dL (ref 30.0–36.0)
MCV: 74.9 fL — ABNORMAL LOW (ref 78.0–100.0)
Platelets: 383 10*3/uL (ref 150–400)
RBC: 4.02 MIL/uL (ref 3.87–5.11)
RDW: 19.1 % — ABNORMAL HIGH (ref 11.5–15.5)
WBC: 12.8 10*3/uL — ABNORMAL HIGH (ref 4.0–10.5)

## 2015-03-28 LAB — HIV ANTIBODY (ROUTINE TESTING W REFLEX): HIV Screen 4th Generation wRfx: NONREACTIVE

## 2015-03-28 LAB — EXPECTORATED SPUTUM ASSESSMENT W GRAM STAIN, RFLX TO RESP C

## 2015-03-28 LAB — URINE CULTURE

## 2015-03-28 MED ORDER — BENZONATATE 100 MG PO CAPS
100.0000 mg | ORAL_CAPSULE | Freq: Three times a day (TID) | ORAL | Status: DC | PRN
  Administered 2015-03-28 (×2): 100 mg via ORAL
  Filled 2015-03-28 (×2): qty 1

## 2015-03-28 MED ORDER — AZITHROMYCIN 250 MG PO TABS
250.0000 mg | ORAL_TABLET | Freq: Every day | ORAL | Status: DC
  Administered 2015-03-28 – 2015-03-30 (×3): 250 mg via ORAL
  Filled 2015-03-28 (×3): qty 1

## 2015-03-28 MED ORDER — CEFUROXIME AXETIL 250 MG PO TABS
250.0000 mg | ORAL_TABLET | Freq: Two times a day (BID) | ORAL | Status: DC
  Administered 2015-03-28 – 2015-03-29 (×2): 250 mg via ORAL
  Filled 2015-03-28 (×5): qty 1

## 2015-03-28 NOTE — Progress Notes (Signed)
[facility] PROGRESS NOTE  Penny Schultz OMB:[id number] DOB: [birth year] DOA: 03/26/2015 PCP: No primary care provider on file.  HPI/Brief narrative 45 y.o. female with no significant past medical history, does not take any meds who presented to [facility] ED with reports of worsening shortness of breath, weakness, fevers, chills, intermittent productive cough of clear sputum.   In ED, pt was hemodynamically stable. BP was 90/63, RR 19-25, HR 106-150, T max 103.2 F, oxygen saturation 94% with [place] oxygen support. Blood work showed hemoglobin of 9.6, creatinine 1.22, lactic acid 4.05. CXR showed bilateral airspace opacities likely pneumonia. She was started on azithro and rocephin. Streptococcus antigen was positive on this admission.  Assessment/Plan:  Sepsis due to Streptococcus pneumoniae ([place])  - Sepsis criteria met on admission with fever, tachycardia, tachypnea, lactic acidosis - Source of infection is Streptococcus pneumonia. Streptococcus antigen was positive on this admission. Legionella is negative. - Blood culture so far are negative - Had been continued on azithromycin and Rocephin - Respiratory status remains stable. - Will transition to PO azithro and ceftin    Acute renal failure (ARF) ([place]) - Likely prerenal - Creatinine normalized with IV fluids   Microcytic anemia - Hemoglobin was 9.6 on the admission and this morning drop noted down to 7.4 - Repeat CBC and was still less than 8, thus pt given 1 unit of PRBC - Hgb 9.3   DVT Prophylaxis  - SCD's bilaterally   Code Status: full Family Communication: Pt in room Disposition Plan: possible d/c in 24-48hrs   Consultants:  PCCM  Procedures:    Antibiotics: Anti-infectives    Start     Dose/Rate Route Frequency Ordered Stop   03/28/15 1700  cefUROXime (CEFTIN) tablet 250 mg     250 mg Oral 2 times daily with meals 03/28/15 1322     03/28/15 1400  azithromycin (ZITHROMAX) tablet 250 mg     250 mg Oral Daily  03/28/15 1322     03/27/15 1500  azithromycin (ZITHROMAX) 500 mg in dextrose 5 % 250 mL IVPB  Status:  Discontinued     500 mg 250 mL/hr over 60 Minutes Intravenous Every 24 hours 03/27/15 0102 03/28/15 1322   03/27/15 1400  cefTRIAXone (ROCEPHIN) 1 g in dextrose 5 % 50 mL IVPB  Status:  Discontinued     1 g 100 mL/hr over 30 Minutes Intravenous Every 24 hours 03/27/15 0102 03/28/15 1322   03/26/15 1430  cefTRIAXone (ROCEPHIN) 1 g in dextrose 5 % 50 mL IVPB     1 g 100 mL/hr over 30 Minutes Intravenous  Once 03/26/15 1417 03/26/15 1515   03/26/15 1430  azithromycin (ZITHROMAX) 500 mg in dextrose 5 % 250 mL IVPB     500 mg 250 mL/hr over 60 Minutes Intravenous  Once 03/26/15 1417 03/26/15 1636      HPI/Subjective: Feels somewhat better. Still with productive cough  Objective: Filed Vitals:   03/27/15 1733 03/27/15 1945 03/28/15 0528 03/28/15 1300  BP: 115/68 122/73 122/69 119/74  Pulse: 97 96 96 91  Temp: 98.4 F (36.9 C) 98.1 F (36.7 C) 98.4 F (36.9 C) 98.2 F (36.8 C)  TempSrc: Oral Oral Oral Oral  Resp: _0 Height:      Weight:      SpO2: 99% 95% 98% 99%    Intake/Output Summary (Last 24 hours) at 03/28/15 1813 Last data filed at 03/28/15 0746  Gross per 24 hour  Intake   1168 ml  Output  300 ml  Net    868 ml   Filed Weights   03/26/15 1325 03/27/15 0054  Weight: 73.483 kg (162 lb) 76.613 kg (168 lb 14.4 oz)    Exam:   General:  Awake, in nad  Cardiovascular: regular, s1, s2  Respiratory: normal resp effort, no wheezing  Abdomen: soft,nondistneded  Musculoskeletal: perfused, no clubbing   Data Reviewed: Basic Metabolic Panel:  Recent Labs Lab 03/26/15 1346 03/27/15 0503  NA 131* 137  K 3.9 3.8  CL 96* 106  CO2 21* 21*  GLUCOSE 177* 106*  BUN 14 14  CREATININE 1.22* 0.78  CALCIUM 8.5* 7.3*   Liver Function Tests:  Recent Labs Lab 03/26/15 1346 03/27/15 0503  AST 36 23  ALT 19 16  ALKPHOS 62 50  BILITOT 0.9 0.7  PROT  8.0 6.0*  ALBUMIN 3.4* 2.5*   No results for input(s): LIPASE, AMYLASE in the last 168 hours. No results for input(s): AMMONIA in the last 168 hours. CBC:  Recent Labs Lab 03/26/15 1346 03/27/15 0503 03/27/15 1230 03/28/15 0353  WBC 7.1 9.6 11.2* 12.8*  NEUTROABS 6.0  --   --   --   HGB 9.6* 7.4* 7.7* 9.3*  HCT 30.8* 24.4* 25.3* 30.1*  MCV 71.6* 72.8* 72.5* 74.9*  PLT 364 302 361 383   Cardiac Enzymes: No results for input(s): CKTOTAL, CKMB, CKMBINDEX, TROPONINI in the last 168 hours. BNP (last 3 results) No results for input(s): BNP in the last 8760 hours.  ProBNP (last 3 results) No results for input(s): PROBNP in the last 8760 hours.  CBG: No results for input(s): GLUCAP in the last 168 hours.  Recent Results (from the past 240 hour(s))  Culture, blood (routine x 2)     Status: None (Preliminary result)   Collection Time: 03/26/15  1:48 PM  Result Value Ref Range Status   Specimen Description BLOOD LEFT ANTECUBITAL  Final   Special Requests BOTTLES DRAWN AEROBIC AND ANAEROBIC 5ML  Final   Culture   Final    NO GROWTH 2 DAYS Performed at [facility]    Report Status PENDING  Incomplete  Culture, blood (routine x 2)     Status: None (Preliminary result)   Collection Time: 03/26/15  1:54 PM  Result Value Ref Range Status   Specimen Description BLOOD RIGHT ANTECUBITAL  Final   Special Requests BOTTLES DRAWN AEROBIC AND ANAEROBIC 5ML  Final   Culture   Final    NO GROWTH 2 DAYS Performed at [facility]    Report Status PENDING  Incomplete  Urine culture     Status: None   Collection Time: 03/26/15  2:17 PM  Result Value Ref Range Status   Specimen Description URINE, CLEAN CATCH  Final   Special Requests NONE  Final   Culture   Final    MULTIPLE SPECIES PRESENT, SUGGEST RECOLLECTION Performed at [facility]    Report Status 03/28/2015 FINAL  Final  Culture, sputum-assessment     Status: None   Collection Time: 03/27/15  5:17 AM   Result Value Ref Range Status   Specimen Description SPUTUM  Final   Special Requests NONE  Final   Sputum evaluation   Final    MICROSCOPIC FINDINGS SUGGEST THAT THIS SPECIMEN IS NOT REPRESENTATIVE OF LOWER RESPIRATORY SECRETIONS. PLEASE RECOLLECT. NOTIFIED LAND RN AT 0530 ON 1.17.17 BY SHUEA    Report Status 03/27/2015 FINAL  Final  Culture, expectorated sputum-assessment     Status: None  Collection Time: 03/28/15  7:28 AM  Result Value Ref Range Status   Specimen Description SPUTUM  Final   Special Requests NONE  Final   Sputum evaluation   Final    THIS SPECIMEN IS ACCEPTABLE. RESPIRATORY CULTURE REPORT TO FOLLOW.   Report Status 03/28/2015 FINAL  Final  Culture, respiratory (NON-Expectorated)     Status: None (Preliminary result)   Collection Time: 03/28/15  7:28 AM  Result Value Ref Range Status   Specimen Description SPUTUM  Final   Special Requests NONE  Final   Gram Stain   Final    FEW WBC PRESENT, PREDOMINANTLY PMN RARE SQUAMOUS EPITHELIAL CELLS PRESENT FEW GRAM NEGATIVE RODS RARE GRAM POSITIVE COCCI IN PAIRS    Culture PENDING  Incomplete   Report Status PENDING  Incomplete     Studies: No results found.  Scheduled Meds: . azithromycin  250 mg Oral Daily  . cefUROXime  250 mg Oral BID WC   Continuous Infusions:   Principal Problem:   Sepsis due to Streptococcus pneumoniae (HCC) Active Problems:   Acute renal failure (ARF) (HCC)   Microcytic anemia   Streptococcal pneumonia ([place])    Huston Stonehocker, [facility] Pager [phone]. If 7PM-7AM, please contact night-coverage at [email or web address], password [facility] 03/28/2015, 6:13 PM  LOS: 2 days

## 2015-03-29 LAB — CBC
HCT: 29 % — ABNORMAL LOW (ref 36.0–46.0)
Hemoglobin: 8.8 g/dL — ABNORMAL LOW (ref 12.0–15.0)
MCH: 22.7 pg — ABNORMAL LOW (ref 26.0–34.0)
MCHC: 30.3 g/dL (ref 30.0–36.0)
MCV: 74.9 fL — ABNORMAL LOW (ref 78.0–100.0)
PLATELETS: 476 10*3/uL — AB (ref 150–400)
RBC: 3.87 MIL/uL (ref 3.87–5.11)
RDW: 19.3 % — AB (ref 11.5–15.5)
WBC: 13.2 10*3/uL — AB (ref 4.0–10.5)

## 2015-03-29 MED ORDER — CEFUROXIME AXETIL 250 MG PO TABS
250.0000 mg | ORAL_TABLET | Freq: Once | ORAL | Status: AC
  Administered 2015-03-29: 250 mg via ORAL
  Filled 2015-03-29: qty 1

## 2015-03-29 MED ORDER — CEFUROXIME AXETIL 500 MG PO TABS
500.0000 mg | ORAL_TABLET | Freq: Two times a day (BID) | ORAL | Status: DC
  Administered 2015-03-29 – 2015-03-30 (×2): 500 mg via ORAL
  Filled 2015-03-29 (×5): qty 1

## 2015-03-29 NOTE — Progress Notes (Signed)
[facility] PROGRESS NOTE  Penny Schultz:[id number] DOB: 06-02-1970 DOA: 03/26/2015 PCP: No primary care provider on file.  HPI/Brief narrative 45 y.o. female with no significant past medical history, does not take any meds who presented to [facility] ED with reports of worsening shortness of breath, weakness, fevers, chills, intermittent productive cough of clear sputum.   In ED, pt was hemodynamically stable. BP was 90/63, RR 19-25, HR 106-150, T max 103.2 F, oxygen saturation 94% with  oxygen support. Blood work showed hemoglobin of 9.6, creatinine 1.22, lactic acid 4.05. CXR showed bilateral airspace opacities likely pneumonia. She was started on azithro and rocephin. Streptococcus antigen was positive on this admission.  Assessment/Plan:  Sepsis due to Streptococcus pneumoniae ([place])  - Sepsis criteria met on admission with fever, tachycardia, tachypnea, lactic acidosis - Source of infection is Streptococcus pneumonia. Streptococcus antigen was positive on this admission. Legionella is negative. - Blood culture so far are negative - Had been continued on azithromycin and Rocephin - Respiratory status remains stable. - Transitioned to PO azithro and ceftin on 1/18 - WBC slightly elevated this AM. Repeat CBC in AM    Acute renal failure (ARF) ([place]) - Likely prerenal - Creatinine normalized with IV fluids   Microcytic anemia, unknown if chronic or acute - Hemoglobin was 9.6 on the admission, later down to 7.4 - Repeat CBC and was still less than 8, thus pt given 1 unit of PRBC - Hgb stable thus far   DVT Prophylaxis  - SCD's bilaterally   Code Status: full Family Communication: Pt in room Disposition Plan: possible d/c in 24-48hrs   Consultants:  PCCM  Procedures:    Antibiotics: Anti-infectives    Start     Dose/Rate Route Frequency Ordered Stop   03/29/15 1700  cefUROXime (CEFTIN) tablet 500 mg     500 mg Oral 2 times daily with meals 03/29/15 1215     03/29/15 1300  cefUROXime (CEFTIN) tablet 250 mg     250 mg Oral  Once 03/29/15 1215 03/29/15 1253   03/28/15 1700  cefUROXime (CEFTIN) tablet 250 mg  Status:  Discontinued     250 mg Oral 2 times daily with meals 03/28/15 1322 03/29/15 1215   03/28/15 1400  azithromycin (ZITHROMAX) tablet 250 mg     250 mg Oral Daily 03/28/15 1322     03/27/15 1500  azithromycin (ZITHROMAX) 500 mg in dextrose 5 % 250 mL IVPB  Status:  Discontinued     500 mg 250 mL/hr over 60 Minutes Intravenous Every 24 hours 03/27/15 0102 03/28/15 1322   03/27/15 1400  cefTRIAXone (ROCEPHIN) 1 g in dextrose 5 % 50 mL IVPB  Status:  Discontinued     1 g 100 mL/hr over 30 Minutes Intravenous Every 24 hours 03/27/15 0102 03/28/15 1322   03/26/15 1430  cefTRIAXone (ROCEPHIN) 1 g in dextrose 5 % 50 mL IVPB     1 g 100 mL/hr over 30 Minutes Intravenous  Once 03/26/15 1417 03/26/15 1515   03/26/15 1430  azithromycin (ZITHROMAX) 500 mg in dextrose 5 % 250 mL IVPB     500 mg 250 mL/hr over 60 Minutes Intravenous  Once 03/26/15 1417 03/26/15 1636      HPI/Subjective: Reports feeling much better today. Cough has improved and pt reports more energy today  Objective: Filed Vitals:   03/28/15 1300 03/28/15 2248 03/29/15 0621 03/29/15 1358  BP: 119/74 119/69 124/63 96/63  Pulse: 91 99 99 96  Temp: 98.2 F (36.8 C)  99.5 F (37.5 C) 98.5 F (36.9 C) 98.8 F (37.1 C)  TempSrc: Oral Oral Oral Oral  Resp: '20 20 20 20  ' Height:      Weight:      SpO2: 99% 97% 99% 98%    Intake/Output Summary (Last 24 hours) at 03/29/15 1549 Last data filed at 03/29/15 9450  Gross per 24 hour  Intake      3 ml  Output      0 ml  Net      3 ml   Filed Weights   03/26/15 1325 03/27/15 0054  Weight: 73.483 kg (162 lb) 76.613 kg (168 lb 14.4 oz)    Exam:   General:  Awake, in nad, laying in bed  Cardiovascular: regular, s1, s2  Respiratory: normal resp effort, no wheezing  Abdomen: soft,nondistneded, pos BS  Musculoskeletal:  perfused, no clubbing, no cyanosis  Data Reviewed: Basic Metabolic Panel:  Recent Labs Lab 03/26/15 1346 03/27/15 0503  NA 131* 137  K 3.9 3.8  CL 96* 106  CO2 21* 21*  GLUCOSE 177* 106*  BUN 14 14  CREATININE 1.22* 0.78  CALCIUM 8.5* 7.3*   Liver Function Tests:  Recent Labs Lab 03/26/15 1346 03/27/15 0503  AST 36 23  ALT 19 16  ALKPHOS 62 50  BILITOT 0.9 0.7  PROT 8.0 6.0*  ALBUMIN 3.4* 2.5*   No results for input(s): LIPASE, AMYLASE in the last 168 hours. No results for input(s): AMMONIA in the last 168 hours. CBC:  Recent Labs Lab 03/26/15 1346 03/27/15 0503 03/27/15 1230 03/28/15 0353 03/29/15 0505  WBC 7.1 9.6 11.2* 12.8* 13.2*  NEUTROABS 6.0  --   --   --   --   HGB 9.6* 7.4* 7.7* 9.3* 8.8*  HCT 30.8* 24.4* 25.3* 30.1* 29.0*  MCV 71.6* 72.8* 72.5* 74.9* 74.9*  PLT 364 302 361 383 476*   Cardiac Enzymes: No results for input(s): CKTOTAL, CKMB, CKMBINDEX, TROPONINI in the last 168 hours. BNP (last 3 results) No results for input(s): BNP in the last 8760 hours.  ProBNP (last 3 results) No results for input(s): PROBNP in the last 8760 hours.  CBG: No results for input(s): GLUCAP in the last 168 hours.  Recent Results (from the past 240 hour(s))  Culture, blood (routine x 2)     Status: None (Preliminary result)   Collection Time: 03/26/15  1:48 PM  Result Value Ref Range Status   Specimen Description BLOOD LEFT ANTECUBITAL  Final   Special Requests BOTTLES DRAWN AEROBIC AND ANAEROBIC 5ML  Final   Culture   Final    NO GROWTH 3 DAYS Performed at [facility]    Report Status PENDING  Incomplete  Culture, blood (routine x 2)     Status: None (Preliminary result)   Collection Time: 03/26/15  1:54 PM  Result Value Ref Range Status   Specimen Description BLOOD RIGHT ANTECUBITAL  Final   Special Requests BOTTLES DRAWN AEROBIC AND ANAEROBIC 5ML  Final   Culture   Final    NO GROWTH 3 DAYS Performed at [facility]    Report  Status PENDING  Incomplete  Urine culture     Status: None   Collection Time: 03/26/15  2:17 PM  Result Value Ref Range Status   Specimen Description URINE, CLEAN CATCH  Final   Special Requests NONE  Final   Culture   Final    MULTIPLE SPECIES PRESENT, SUGGEST RECOLLECTION Performed at [facility]  Report Status 03/28/2015 FINAL  Final  Culture, sputum-assessment     Status: None   Collection Time: 03/27/15  5:17 AM  Result Value Ref Range Status   Specimen Description SPUTUM  Final   Special Requests NONE  Final   Sputum evaluation   Final    MICROSCOPIC FINDINGS SUGGEST THAT THIS SPECIMEN IS NOT REPRESENTATIVE OF LOWER RESPIRATORY SECRETIONS. PLEASE RECOLLECT. NOTIFIED LAND RN AT 0530 ON 1.17.17 BY SHUEA    Report Status 03/27/2015 FINAL  Final  Culture, expectorated sputum-assessment     Status: None   Collection Time: 03/28/15  7:28 AM  Result Value Ref Range Status   Specimen Description SPUTUM  Final   Special Requests NONE  Final   Sputum evaluation   Final    THIS SPECIMEN IS ACCEPTABLE. RESPIRATORY CULTURE REPORT TO FOLLOW.   Report Status 03/28/2015 FINAL  Final  Culture, respiratory (NON-Expectorated)     Status: None (Preliminary result)   Collection Time: 03/28/15  7:28 AM  Result Value Ref Range Status   Specimen Description SPUTUM  Final   Special Requests NONE  Final   Gram Stain   Final    FEW WBC PRESENT, PREDOMINANTLY PMN RARE SQUAMOUS EPITHELIAL CELLS PRESENT FEW GRAM NEGATIVE RODS RARE GRAM POSITIVE COCCI IN PAIRS    Culture   Final    Culture reincubated for better growth Performed at [facility]    Report Status PENDING  Incomplete     Studies: No results found.  Scheduled Meds: . azithromycin  250 mg Oral Daily  . cefUROXime  500 mg Oral BID WC   Continuous Infusions:   Principal Problem:   Sepsis due to Streptococcus pneumoniae (HCC) Active Problems:   Acute renal failure (ARF) (HCC)   Microcytic anemia    Streptococcal pneumonia ([place])    Faithanne Verret, [place] [facility] Pager [phone]. If 7PM-7AM, please contact night-coverage at [email or web address], password [facility] 03/29/2015, 3:49 PM  LOS: 3 days

## 2015-03-29 NOTE — Progress Notes (Signed)
PT demonstrated verbal and hands on understanding of Flutter device. 

## 2015-03-30 LAB — CBC
HEMATOCRIT: 30 % — AB (ref 36.0–46.0)
HEMOGLOBIN: 9.1 g/dL — AB (ref 12.0–15.0)
MCH: 23 pg — AB (ref 26.0–34.0)
MCHC: 30.3 g/dL (ref 30.0–36.0)
MCV: 75.9 fL — ABNORMAL LOW (ref 78.0–100.0)
Platelets: 542 10*3/uL — ABNORMAL HIGH (ref 150–400)
RBC: 3.95 MIL/uL (ref 3.87–5.11)
RDW: 19.6 % — AB (ref 11.5–15.5)
WBC: 12.6 10*3/uL — ABNORMAL HIGH (ref 4.0–10.5)

## 2015-03-30 LAB — RESPIRATORY VIRUS PANEL
Adenovirus: NEGATIVE
Influenza A: POSITIVE — AB
Influenza B: NEGATIVE
Metapneumovirus: NEGATIVE
Parainfluenza 1: NEGATIVE
Parainfluenza 2: NEGATIVE
Parainfluenza 3: NEGATIVE
Respiratory Syncytial Virus A: NEGATIVE
Respiratory Syncytial Virus B: NEGATIVE
Rhinovirus: NEGATIVE

## 2015-03-30 LAB — CULTURE, RESPIRATORY W GRAM STAIN: Culture: NORMAL

## 2015-03-30 LAB — CULTURE, RESPIRATORY

## 2015-03-30 MED ORDER — BENZONATATE 100 MG PO CAPS: 100.0000 mg | ORAL_CAPSULE | Freq: Three times a day (TID) | ORAL | Status: DC | PRN

## 2015-03-30 MED ORDER — AZITHROMYCIN 250 MG PO TABS: ORAL_TABLET | ORAL | Status: DC

## 2015-03-30 MED ORDER — CEFUROXIME AXETIL 500 MG PO TABS: 500.0000 mg | ORAL_TABLET | Freq: Two times a day (BID) | ORAL | Status: DC

## 2015-03-30 NOTE — Discharge Summary (Signed)
Physician Discharge Summary  Penny Schultz RDE:[id number] DOB: [birth year] DOA: 03/26/2015  PCP: No primary care provider on file.  Admit date: 03/26/2015 Discharge date: 03/30/2015  Time spent: 20 minutes  Recommendations for Outpatient Follow-up:  1. Follow up with PCP in 2-3 weeks   Discharge Diagnoses:  Principal Problem:   Sepsis due to Streptococcus pneumoniae [facility]) Active Problems:   Acute renal failure (ARF) (HCC)   Microcytic anemia   Streptococcal pneumonia [facility])   Discharge Condition: Improved  Diet recommendation: Regular  Filed Weights   03/26/15 1325 03/27/15 0054 03/29/15 2134  Weight: 73.483 kg (162 lb) 76.613 kg (168 lb 14.4 oz) 76.204 kg (168 lb)    History of present illness:  Please review dictated H and P from 1/16 for details. Briefly, 45 y.o. female with no significant past medical history, does not take any meds who presented to [facility] ED with reports of worsening shortness of breath, weakness, fevers, chills, intermittent productive cough of clear sputum.   In ED, pt was hemodynamically stable. BP was 90/63, RR 19-25, HR 106-150, T max 103.2 F, oxygen saturation 94% with [place] oxygen support. Blood work showed hemoglobin of 9.6, creatinine 1.22, lactic acid 4.05. CXR showed bilateral airspace opacities likely pneumonia. She was started on azithro and rocephin. Streptococcus antigen was positive on this admission.  Hospital Course:   Sepsis due to Streptococcus pneumoniae ([place])  - Sepsis criteria met on admission with fever, tachycardia, tachypnea, lactic acidosis - Source of infection is Streptococcus pneumonia. Streptococcus antigen was positive on this admission. Legionella is negative. - Blood culture so far are negative - Had been continued initially on azithromycin and Rocephin - Respiratory status remains stable. - Transitioned to PO azithro and ceftin on 1/18 - WBC slightly elevated this AM. Repeat CBC in AM   Acute renal failure (ARF) (HCC) -  Likely prerenal - Creatinine normalized with IV fluids   Microcytic anemia, unknown if chronic or acute - Hemoglobin was 9.6 on the admission, later down to 7.4 - Repeat CBC and was still less than 8, thus pt given 1 unit of PRBC - Hgb has remained stable thus far   DVT Prophylaxis  - SCD's bilaterally   Discharge Exam: Filed Vitals:   03/29/15 0621 03/29/15 1358 03/29/15 2134 03/30/15 0547  BP: 124/63 96/63 121/70 128/73  Pulse: 99 96 93 88  Temp: 98.5 F (36.9 C) 98.8 F (37.1 C) 98.7 F (37.1 C) 98.2 F (36.8 C)  TempSrc: Oral Oral Oral Oral  Resp: _0 Height:   _1  (1.727 m)   Weight:   76.204 kg (168 lb)   SpO2: 99% 98% 100% 100%    General: Awake, in nad Cardiovascular: regular, s1, s2 Respiratory: normal resp effort, no wheezing  Discharge Instructions     Medication List    STOP taking these medications        BC HEADACHE PO      TAKE these medications        ALKA SELTZER PLUS PO  Take 2 tablets by mouth every 4 (four) hours as needed (for cold).     azithromycin 250 MG tablet  Commonly known as:  ZITHROMAX  Take one tab po qday x 3 more days, zero refills     benzonatate 100 MG capsule  Commonly known as:  TESSALON  Take 1 capsule (100 mg total) by mouth 3 (three) times daily as needed for cough.     cefUROXime 500 MG tablet  Commonly known as:  CEFTIN  Take 1 tablet (500 mg total) by mouth 2 (two) times daily with a meal.     THERAFLU FLU/CHEST CONGESTION 1000-400 MG Pack  Generic drug:  Acetaminophen-Guaifenesin  Take 1 packet by mouth once.       No Known Allergies Follow-up Information    Follow up with Please go to [email or web address], locate find a doctor area to use to find in network primary care provider and specialists .   Why:  recommend [facility]   Contact information:   Please verify any provider recommended to you is in network       The results of significant diagnostics from this  hospitalization (including imaging, microbiology, ancillary and laboratory) are listed below for reference.    Significant Diagnostic Studies: Dg Chest 2 View  03/26/2015  CLINICAL DATA:  Productive cough, chest pain and fever for 1 week. Initial encounter. EXAM: CHEST  2 VIEW COMPARISON:  None. FINDINGS: Extensive airspace opacity is present in the right upper, right middle and left lower lobes. Heart size is normal. No pneumothorax or pleural effusion. IMPRESSION: Bilateral airspace disease most consistent with pneumonia. Recommend plain film follow-up to clearing. Electronically Signed   By: Inge Rise M.D.   On: 03/26/2015 13:52    Microbiology: Recent Results (from the past 240 hour(s))  Culture, blood (routine x 2)     Status: None (Preliminary result)   Collection Time: 03/26/15  1:48 PM  Result Value Ref Range Status   Specimen Description BLOOD LEFT ANTECUBITAL  Final   Special Requests BOTTLES DRAWN AEROBIC AND ANAEROBIC 5ML  Final   Culture   Final    NO GROWTH 3 DAYS Performed at [facility]    Report Status PENDING  Incomplete  Culture, blood (routine x 2)     Status: None (Preliminary result)   Collection Time: 03/26/15  1:54 PM  Result Value Ref Range Status   Specimen Description BLOOD RIGHT ANTECUBITAL  Final   Special Requests BOTTLES DRAWN AEROBIC AND ANAEROBIC 5ML  Final   Culture   Final    NO GROWTH 3 DAYS Performed at [facility]    Report Status PENDING  Incomplete  Urine culture     Status: None   Collection Time: 03/26/15  2:17 PM  Result Value Ref Range Status   Specimen Description URINE, CLEAN CATCH  Final   Special Requests NONE  Final   Culture   Final    MULTIPLE SPECIES PRESENT, SUGGEST RECOLLECTION Performed at [facility]    Report Status 03/28/2015 FINAL  Final  Culture, sputum-assessment     Status: None   Collection Time: 03/27/15  5:17 AM  Result Value Ref Range Status   Specimen Description SPUTUM  Final    Special Requests NONE  Final   Sputum evaluation   Final    MICROSCOPIC FINDINGS SUGGEST THAT THIS SPECIMEN IS NOT REPRESENTATIVE OF LOWER RESPIRATORY SECRETIONS. PLEASE RECOLLECT. NOTIFIED LAND RN AT 0530 ON 1.17.17 BY SHUEA    Report Status 03/27/2015 FINAL  Final  Culture, expectorated sputum-assessment     Status: None   Collection Time: 03/28/15  7:28 AM  Result Value Ref Range Status   Specimen Description SPUTUM  Final   Special Requests NONE  Final   Sputum evaluation   Final    THIS SPECIMEN IS ACCEPTABLE. RESPIRATORY CULTURE REPORT TO FOLLOW.   Report Status 03/28/2015 FINAL  Final  Culture, respiratory (NON-Expectorated)  Status: None   Collection Time: 03/28/15  7:28 AM  Result Value Ref Range Status   Specimen Description SPUTUM  Final   Special Requests NONE  Final   Gram Stain   Final    FEW WBC PRESENT, PREDOMINANTLY PMN RARE SQUAMOUS EPITHELIAL CELLS PRESENT FEW GRAM NEGATIVE RODS RARE GRAM POSITIVE COCCI IN PAIRS    Culture   Final    NORMAL OROPHARYNGEAL FLORA Performed at [facility]    Report Status 03/30/2015 FINAL  Final     Labs: Basic Metabolic Panel:  Recent Labs Lab 03/26/15 1346 03/27/15 0503  NA 131* 137  K 3.9 3.8  CL 96* 106  CO2 21* 21*  GLUCOSE 177* 106*  BUN 14 14  CREATININE 1.22* 0.78  CALCIUM 8.5* 7.3*   Liver Function Tests:  Recent Labs Lab 03/26/15 1346 03/27/15 0503  AST 36 23  ALT 19 16  ALKPHOS 62 50  BILITOT 0.9 0.7  PROT 8.0 6.0*  ALBUMIN 3.4* 2.5*   No results for input(s): LIPASE, AMYLASE in the last 168 hours. No results for input(s): AMMONIA in the last 168 hours. CBC:  Recent Labs Lab 03/26/15 1346 03/27/15 0503 03/27/15 1230 03/28/15 0353 03/29/15 0505 03/30/15 0345  WBC 7.1 9.6 11.2* 12.8* 13.2* 12.6*  NEUTROABS 6.0  --   --   --   --   --   HGB 9.6* 7.4* 7.7* 9.3* 8.8* 9.1*  HCT 30.8* 24.4* 25.3* 30.1* 29.0* 30.0*  MCV 71.6* 72.8* 72.5* 74.9* 74.9* 75.9*  PLT 364 302 361  383 476* 542*   Cardiac Enzymes: No results for input(s): CKTOTAL, CKMB, CKMBINDEX, TROPONINI in the last 168 hours. BNP: BNP (last 3 results) No results for input(s): BNP in the last 8760 hours.  ProBNP (last 3 results) No results for input(s): PROBNP in the last 8760 hours.  CBG: No results for input(s): GLUCAP in the last 168 hours.     Signed:  CHIU, Orpah Melter  [facility] 03/30/2015, 9:56 AM

## 2015-03-31 LAB — CULTURE, BLOOD (ROUTINE X 2)
Culture: NO GROWTH
Culture: NO GROWTH

## 2015-05-26 ENCOUNTER — Ambulatory Visit (INDEPENDENT_AMBULATORY_CARE_PROVIDER_SITE_OTHER): Payer: 59 | Admitting: Internal Medicine

## 2015-05-26 ENCOUNTER — Ambulatory Visit (INDEPENDENT_AMBULATORY_CARE_PROVIDER_SITE_OTHER): Payer: 59

## 2015-05-26 VITALS — BP 118/64 | HR 77 | Temp 98.2°F | Resp 16 | Ht 66.0 in | Wt 172.0 lb

## 2015-05-26 DIAGNOSIS — Z23 Encounter for immunization: Secondary | ICD-10-CM

## 2015-05-26 DIAGNOSIS — J189 Pneumonia, unspecified organism: Secondary | ICD-10-CM

## 2015-05-26 DIAGNOSIS — H6121 Impacted cerumen, right ear: Secondary | ICD-10-CM | POA: Diagnosis not present

## 2015-05-26 NOTE — Progress Notes (Signed)
   Subjective:  By signing my name below, I, Stann Oresung-Kai Tsai, attest that this documentation has been prepared under the direction and in the presence of Ellamae Siaobert Doolittle, MD. Electronically Signed: Stann Oresung-Kai Tsai, Scribe. 05/26/2015 , 8:56 AM .  Patient was seen in Room 12 .   Patient ID: Penny Schultz, female    DOB: 12/22/1970, 45 y.o.   MRN: [id number] Chief Complaint  Patient presents with  . Ear Fullness    Right ear  . Follow-up    Pneumonia Hospitalization in January  . Flu Vaccine   HPI Penny Schultz is a 45 y.o. female who [facility] follow up from ED. She was hospitalized for pneumonia in January. She states that her right ear still feels full and stopped up with some hearing loss. She's tried cleaning out her right ear with some relief.   Patient Active Problem List   Diagnosis Date Noted  . Streptococcal pneumonia (HCC) 03/27/2015  . Sepsis due to Streptococcus pneumoniae (HCC) 03/27/2015  . Acute renal failure (ARF) (HCC) 03/26/2015  . Microcytic anemia 03/26/2015    Current outpatient prescriptions:  Off all  Review of Systems  Constitutional: Negative for fever, chills, activity change and fatigue.  HENT: Positive for ear pain and hearing loss. Negative for congestion and rhinorrhea.   Respiratory: Negative for cough, shortness of breath and wheezing.   Gastrointestinal: Negative for nausea, vomiting and diarrhea.     Objective:   Physical Exam  Constitutional: She is oriented to person, place, and time. She appears well-developed and well-nourished. No distress.  HENT:  Head: Normocephalic and atraumatic.  Left Ear: Tympanic membrane normal.  Nose: Nose normal.  Right ear canal occluded with cerumen Left ear canal was clear  Eyes: EOM are normal. Pupils are equal, round, and reactive to light.  Neck: Neck supple.  Cardiovascular: Normal rate.   Pulmonary/Chest: Effort normal and breath sounds normal. No respiratory distress. She has no decreased  breath sounds.  Musculoskeletal: Normal range of motion.  Lymphadenopathy:    She has no cervical adenopathy.  Neurological: She is alert and oriented to person, place, and time.  Skin: Skin is warm and dry.  Psychiatric: She has a normal mood and affect. Her behavior is normal.  Nursing note and vitals reviewed.  BP 118/64 mmHg  Pulse 77  Temp(Src) 98.2 F (36.8 C) (Oral)  Resp 16  Ht 5\' 6"  (1.676 m)  Wt 172 lb (78.019 kg)  BMI 27.77 kg/m2  SpO2 98%   [facility] reading (PRIMARY) by Dr. Merla Richesoolittle : chest xray: pneumonia has resolved    Assessment & Plan:  I have completed the patient encounter in its entirety as documented by the scribe, with editing by me where necessary. Robert P. Merla Richesoolittle, M.D. CAP (community acquired pneumonia) - Plan: DG Chest 2 View proves resolution  Flu vaccine need - Plan: Flu Vaccine QUAD 36+ mos IM  Cerumen impaction, right--resolved!

## 2015-05-26 NOTE — Patient Instructions (Signed)
     IF you received an x-ray today, you will receive an invoice from [facility]. Please contact [facility] at [phone] with questions or concerns regarding your invoice.   IF you received labwork today, you will receive an invoice from [facility]. Please contact [facility] at [phone] with questions or concerns regarding your invoice.   Our billing staff will not be able to assist you with questions regarding bills from these companies.  You will be contacted with the lab results as soon as they are available. The fastest way to get your results is to activate your My Chart account. Instructions are located on the last page of this paperwork. If you have not heard from us regarding the results in 2 weeks, please contact this office.      

## 2016-08-09 IMAGING — CR DG CHEST 2V
2 series · 2 of 2 positions shown · non-contrast
Comparison: March 26, 2015

CLINICAL DATA: Recent pneumonia

EXAM:
CHEST  2 VIEW

[PA]
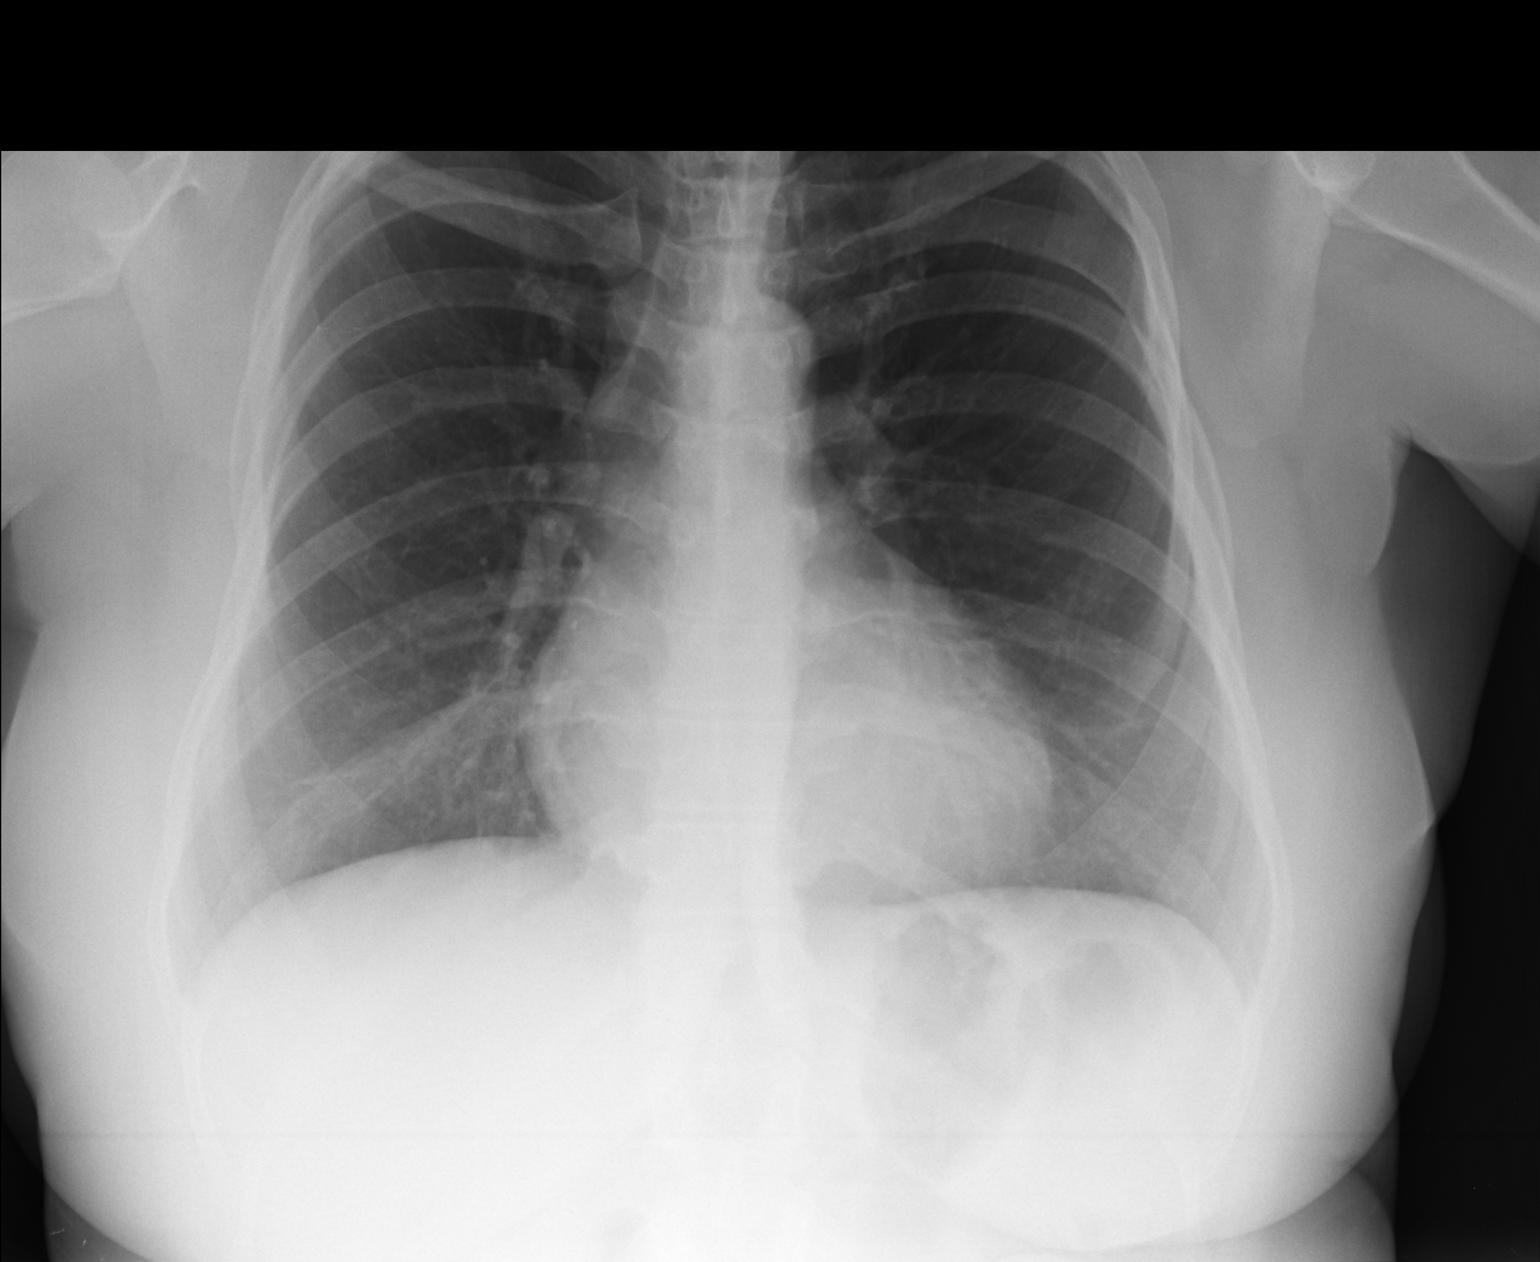

[lateral]
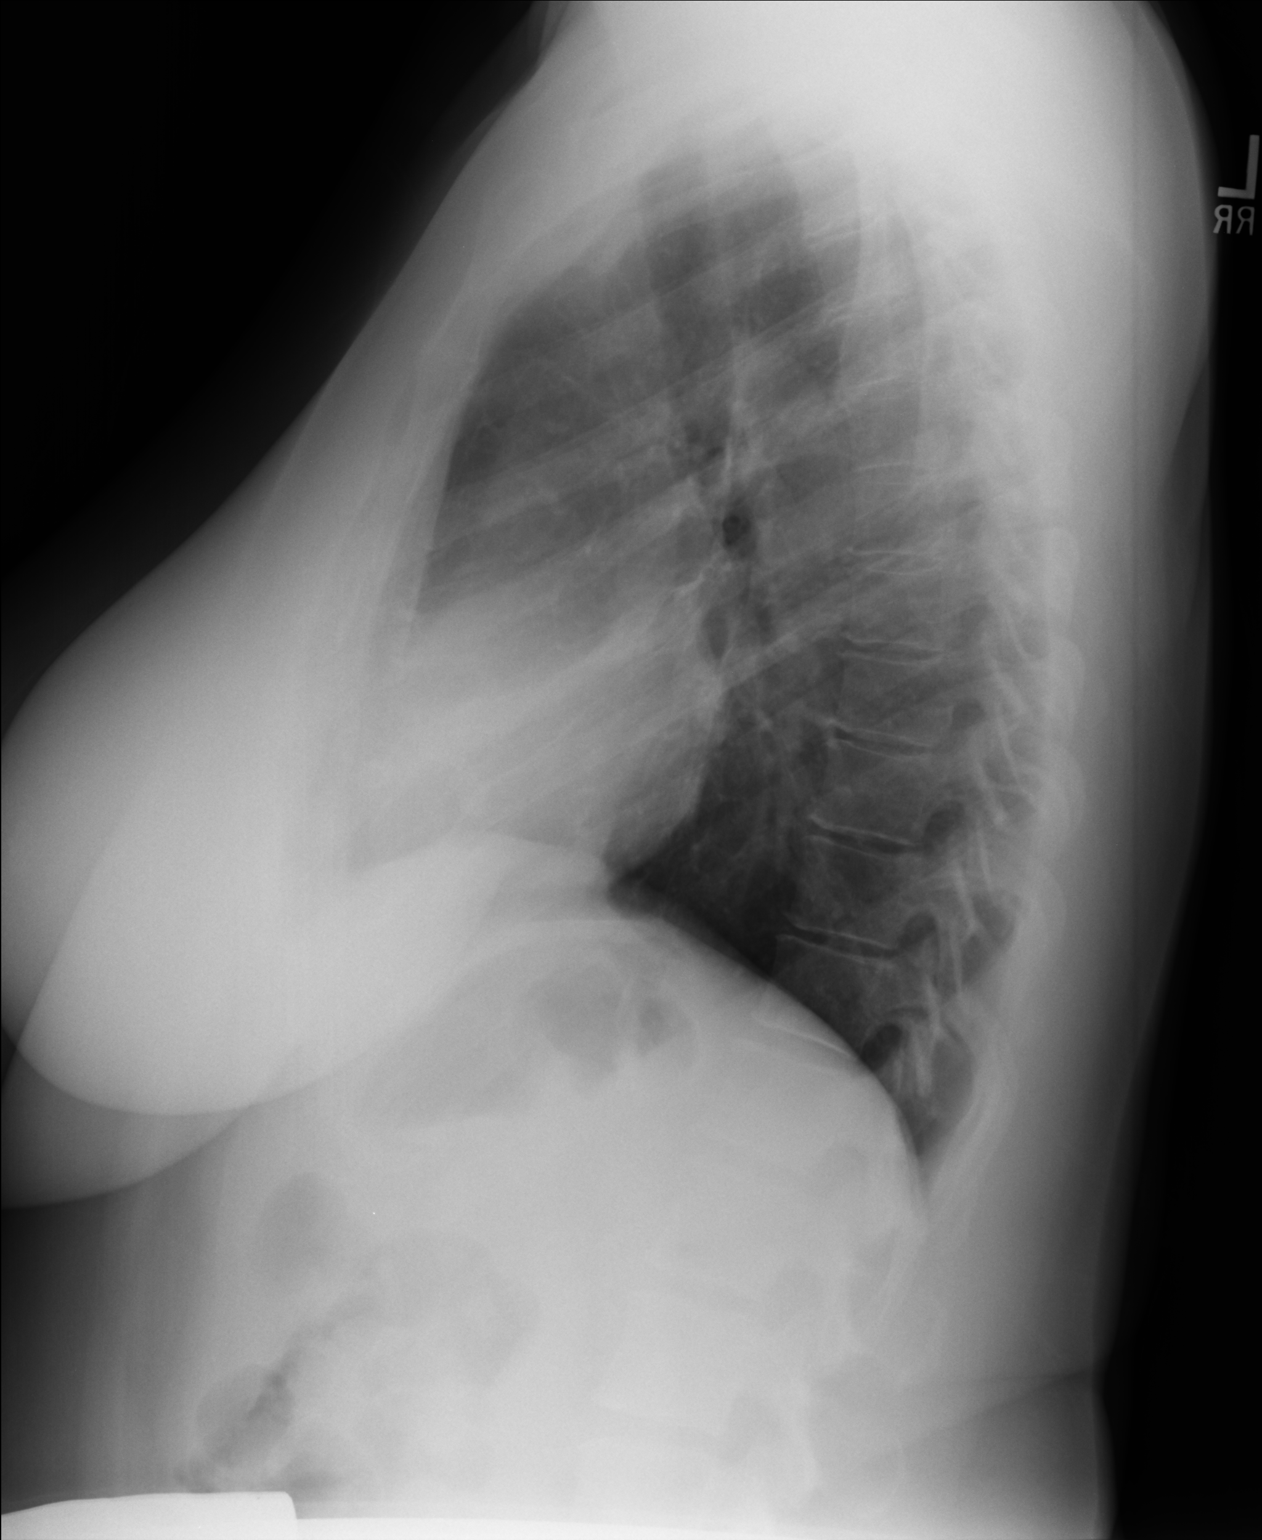

[2 of 2 positions shown; findings below may reference images not displayed]

FINDINGS: There has been interval clearing of airspace consolidation from both
lungs. Currently, there is minimal subsegmental atelectasis in each
lower lobe. Lungs elsewhere clear. Heart size and pulmonary
vascularity are normal. No adenopathy. No bone lesions.
IMPRESSION: Currently minimal subsegmental atelectasis bilaterally. Areas of
infiltrate bilaterally have cleared. No new opacity. Cardiac
silhouette within normal limits.

## 2018-06-10 ENCOUNTER — Emergency Department (HOSPITAL_COMMUNITY)
Admission: EM | Admit: 2018-06-10 | Discharge: 2018-06-10 | Disposition: A | Discharge status: Home / Self Care | Payer: 59 | Attending: Emergency Medicine | Admitting: Emergency Medicine

## 2018-06-10 ENCOUNTER — Other Ambulatory Visit: Payer: Self-pay

## 2018-06-10 ENCOUNTER — Encounter (HOSPITAL_COMMUNITY): Payer: Self-pay

## 2018-06-10 DIAGNOSIS — Z87891 Personal history of nicotine dependence: Secondary | ICD-10-CM | POA: Diagnosis not present

## 2018-06-10 DIAGNOSIS — M549 Dorsalgia, unspecified: Secondary | ICD-10-CM | POA: Diagnosis present

## 2018-06-10 MED ORDER — PREDNISONE 10 MG (21) PO TBPK: ORAL_TABLET | Freq: Every day | ORAL | 0 refills | Status: DC

## 2018-06-10 MED ORDER — METHOCARBAMOL 750 MG PO TABS: 750.0000 mg | ORAL_TABLET | Freq: Four times a day (QID) | ORAL | 0 refills | Status: DC

## 2018-06-10 NOTE — ED Provider Notes (Signed)
[facility]-EMERGENCY DEPT Provider Note   CSN: [id number] Arrival date & time: 06/10/18  5929    History   Chief Complaint No chief complaint on file.   HPI Penny Schultz is a 48 y.o. female.     48 year old female presents with left-sided back pain which she states is consistent with her prior chronic back pain.  Pain does radiate down to her left leg and is worse with movement.  Denies any foot drop with walking.  No bowel or bladder dysfunction.  Denies any urinary symptoms.  Pain characterizes sharp and better with rest.  She has been medicating with ibuprofen with limited relief.  Denies any new injury     Past Medical History:  Diagnosis Date  . Blood transfusion without reported diagnosis     Patient Active Problem List   Diagnosis Date Noted  . Streptococcal pneumonia (HCC) 03/27/2015  . Sepsis due to Streptococcus pneumoniae (HCC) 03/27/2015  . Acute renal failure (ARF) (HCC) 03/26/2015  . Microcytic anemia 03/26/2015    Past Surgical History:  Procedure Laterality Date  . open heart surgery 48 years old       OB History   No obstetric history on file.      Home Medications    Prior to Admission medications   Medication Sig Start Date End Date Taking? Authorizing Provider  Acetaminophen-Guaifenesin (THERAFLU FLU/CHEST CONGESTION) 1000-400 MG PACK Take 1 packet by mouth once. Reported on 05/26/2015    [provider]  azithromycin (ZITHROMAX) 250 MG tablet Take one tab po qday x 3 more days, zero refills Patient not taking: Reported on 05/26/2015 03/30/15   Jerald Kief, MD  benzonatate (TESSALON) 100 MG capsule Take 1 capsule (100 mg total) by mouth 3 (three) times daily as needed for cough. Patient not taking: Reported on 05/26/2015 03/30/15   Jerald Kief, MD  cefUROXime (CEFTIN) 500 MG tablet Take 1 tablet (500 mg total) by mouth 2 (two) times daily with a meal. Patient not taking: Reported on 05/26/2015 03/30/15   Jerald Kief, MD  Phenyleph-Doxylamine-DM-APAP (ALKA SELTZER PLUS PO) Take 2 tablets by mouth every 4 (four) hours as needed (for cold). Reported on 05/26/2015    [provider]    Family History Family History  Problem Relation Age of Onset  . Diabetes Mother   . Hypertension Mother   . Diabetes Father   . Heart disease Father     Social History Social History   Tobacco Use  . Smoking status: Former Smoker    Packs/day: 1.00    Types: Cigarettes  Substance Use Topics  . Alcohol use: No    Alcohol/week: 0.0 standard drinks  . Drug use: Yes    Types: Marijuana     Allergies   Patient has no known allergies.   Review of Systems Review of Systems  All other systems reviewed and are negative.    Physical Exam Updated Vital Signs BP (!) 173/79   Pulse 86   Temp 98 F (36.7 C) (Oral)   Resp 20   Ht 1.676 m (5\' 6" )   Wt 70.3 kg   SpO2 100%   BMI 25.02 kg/m   Physical Exam Vitals signs and nursing note reviewed.  Constitutional:      General: She is not in acute distress.    Appearance: Normal appearance. She is well-developed. She is not toxic-appearing.  HENT:     Head: Normocephalic and atraumatic.  Eyes:  General: Lids are normal.     Conjunctiva/sclera: Conjunctivae normal.     Pupils: Pupils are equal, round, and reactive to light.  Neck:     Musculoskeletal: Normal range of motion and neck supple.     Thyroid: No thyroid mass.     Trachea: No tracheal deviation.  Cardiovascular:     Rate and Rhythm: Normal rate and regular rhythm.     Heart sounds: Normal heart sounds. No murmur. No gallop.   Pulmonary:     Effort: Pulmonary effort is normal. No respiratory distress.     Breath sounds: Normal breath sounds. No stridor. No decreased breath sounds, wheezing, rhonchi or rales.  Abdominal:     General: Bowel sounds are normal. There is no distension.     Palpations: Abdomen is soft.     Tenderness: There is no abdominal tenderness. There  is no rebound.  Musculoskeletal: Normal range of motion.        General: No tenderness.       Back:  Skin:    General: Skin is warm and dry.     Findings: No abrasion or rash.  Neurological:     Mental Status: She is alert and oriented to person, place, and time.     GCS: GCS eye subscore is 4. GCS verbal subscore is 5. GCS motor subscore is 6.     Cranial Nerves: No cranial nerve deficit.     Sensory: No sensory deficit.     Deep Tendon Reflexes:     Reflex Scores:      Patellar reflexes are 2+ on the right side and 2+ on the left side. Psychiatric:        Speech: Speech normal.        Behavior: Behavior normal.      ED Treatments / Results  Labs (all labs ordered are listed, but only abnormal results are displayed) Labs Reviewed - No data to display  EKG None  Radiology No results found.  Procedures Procedures (including critical care time)  Medications Ordered in ED Medications - No data to display   Initial Impression / Assessment and Plan / ED Course  I have reviewed the triage vital signs and the nursing notes.  Pertinent labs & imaging results that were available during my care of the patient were reviewed by me and considered in my medical decision making (see chart for details).        Patient to be treated musculoskeletal back pain with short course of prednisone as well as muscle relaxants.  Final Clinical Impressions(s) / ED Diagnoses   Final diagnoses:  None    ED Discharge Orders    None       Lorre Nick, MD 06/10/18 [phone]

## 2018-06-10 NOTE — ED Triage Notes (Signed)
Pt c/o of L lower back pain.  Pt works in a cigarette factory, and pushes lots of weight at work.  Pt c/o of pain starting Sunday, that has increased since then.

## 2018-07-06 DIAGNOSIS — M47816 Spondylosis without myelopathy or radiculopathy, lumbar region: Secondary | ICD-10-CM | POA: Insufficient documentation

## 2018-08-30 LAB — RESULTS CONSOLE HPV: CHL HPV: NEGATIVE

## 2018-08-30 LAB — HM PAP SMEAR

## 2018-10-04 ENCOUNTER — Ambulatory Visit (INDEPENDENT_AMBULATORY_CARE_PROVIDER_SITE_OTHER): Payer: 59 | Admitting: Family Medicine

## 2018-10-04 ENCOUNTER — Other Ambulatory Visit: Payer: Self-pay

## 2018-10-04 ENCOUNTER — Encounter: Payer: Self-pay | Admitting: Family Medicine

## 2018-10-04 VITALS — BP 133/81 | HR 77 | Temp 98.8°F | Resp 17 | Ht 66.0 in | Wt 171.0 lb

## 2018-10-04 DIAGNOSIS — Z130 Encounter for screening for diseases of the blood and blood-forming organs and certain disorders involving the immune mechanism: Secondary | ICD-10-CM

## 2018-10-04 DIAGNOSIS — E663 Overweight: Secondary | ICD-10-CM | POA: Diagnosis not present

## 2018-10-04 DIAGNOSIS — Z862 Personal history of diseases of the blood and blood-forming organs and certain disorders involving the immune mechanism: Secondary | ICD-10-CM

## 2018-10-04 DIAGNOSIS — Z791 Long term (current) use of non-steroidal anti-inflammatories (NSAID): Secondary | ICD-10-CM

## 2018-10-04 DIAGNOSIS — Z131 Encounter for screening for diabetes mellitus: Secondary | ICD-10-CM | POA: Diagnosis not present

## 2018-10-04 DIAGNOSIS — Z13220 Encounter for screening for lipoid disorders: Secondary | ICD-10-CM

## 2018-10-04 DIAGNOSIS — R030 Elevated blood-pressure reading, without diagnosis of hypertension: Secondary | ICD-10-CM

## 2018-10-04 DIAGNOSIS — R7989 Other specified abnormal findings of blood chemistry: Secondary | ICD-10-CM

## 2018-10-04 DIAGNOSIS — M546 Pain in thoracic spine: Secondary | ICD-10-CM

## 2018-10-04 MED ORDER — CYCLOBENZAPRINE HCL 5 MG PO TABS: 5.0000 mg | ORAL_TABLET | Freq: Three times a day (TID) | ORAL | 1 refills | Status: DC | PRN

## 2018-10-04 MED ORDER — DICLOFENAC SODIUM 1 % TD GEL: 2.0000 g | Freq: Four times a day (QID) | TRANSDERMAL | 6 refills | Status: DC

## 2018-10-04 MED ORDER — IBUPROFEN 800 MG PO TABS: 800.0000 mg | ORAL_TABLET | Freq: Three times a day (TID) | ORAL | 0 refills | Status: DC | PRN

## 2018-10-04 NOTE — Progress Notes (Signed)
New Patient Office Visit  Subjective:  Patient ID: Penny Schultz, female    DOB: 05/17/1970  Age: 48 y.o. MRN: [id number]  CC:  Chief Complaint  Patient presents with  . New Patient (Initial Visit)    cpe, no pap or mammo needed pt recieved pap and mammogram with gyn 08/2018  . Back Pain    catch in back since started working 21 day straight cycle.  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 She reports that the PT helped her greatly.  She takes ibuprofen 800mg  for body aches and pains before work and after work.  She denies blood in her BM She denies history of hypertension or kidney disease She stopped smoking in 2017 after bilateral pneumonia.  She had her pap smear with OB/GYN and is up to date with pap smear and mammogram  Anemia Her records from 2017 shows very  Low hemoglobin At that time she was in the hospital with bilateral pneumonia She denies taking iron supplement   Family history of diabetes She has a family history of diabetes in her mother and reports that she would like to be checked for diabetes She denies polyuria or polydipsia   Past Medical History:  Diagnosis Date  . Blood transfusion without reported diagnosis     Past Surgical History:  Procedure Laterality Date  . open heart surgery 48 years old      Family History  Problem Relation Age of Onset  . Diabetes Mother   . Hypertension Mother   . Diabetes Father   . Heart  disease Father     Social History   Socioeconomic History  . Marital status: Single    Spouse name: Not on file  . Number of children: Not on file  . Years of education: Not on file  . Highest education level: Not on file  Occupational History  . Not on file  Social Needs  . Financial resource strain: Not on file  . Food insecurity    Worry: Not on file    Inability: Not on file  . Transportation needs    Medical: Not on file    Non-medical: Not on file  Tobacco Use  . Smoking status: Former Smoker    Packs/day: 1.00    Types: Cigarettes  . Smokeless tobacco: Never Used  Substance and Sexual Activity  . Alcohol use: No    Alcohol/week: 0.0 standard drinks  . Drug use: Yes    Types: Marijuana  . Sexual activity: Not on file  Lifestyle  . Physical activity    Days per week: Not on file    Minutes per session: Not on file  . Stress: Not on file  Relationships  . Social Musicianconnections    Talks on phone: Not on file    Gets together: Not on file    Attends religious service: Not on file  Active member of club or organization: Not on file    Attends meetings of clubs or organizations: Not on file    Relationship status: Not on file  . Intimate partner violence    Fear of current or ex partner: Not on file    Emotionally abused: Not on file    Physically abused: Not on file    Forced sexual activity: Not on file  Other Topics Concern  . Not on file  Social History Narrative  . Not on file    ROS Review of Systems Review of Systems  Constitutional: Negative for activity change, appetite change, chills and fever.  HENT: Negative for congestion, nosebleeds, trouble swallowing and voice change.   Respiratory: Negative for cough, shortness of breath and wheezing.   Gastrointestinal: Negative for diarrhea, nausea and vomiting.  Genitourinary: Negative for difficulty urinating, dysuria, flank pain and hematuria.  Musculoskeletal: Negative for back pain, joint swelling  and neck pain.  Neurological: Negative for dizziness, speech difficulty, light-headedness and numbness.  See HPI. All other review of systems negative.   Objective:   Today's Vitals: BP 133/81 (BP Location: Right Arm, Patient Position: Sitting, Cuff Size: Normal)   Pulse 77   Temp 98.8 F (37.1 C) (Oral)   Resp 17   Ht 5\' 6"  (1.676 m)   Wt 171 lb (77.6 kg)   SpO2 100%   BMI 27.60 kg/m   Physical Exam Constitutional:      Appearance: Normal appearance. She is normal weight.  HENT:     Head: Normocephalic and atraumatic.  Eyes:     Extraocular Movements: Extraocular movements intact.     Conjunctiva/sclera: Conjunctivae normal.  Neck:     Musculoskeletal: Normal range of motion and neck supple.     Comments: No thyromegaly Cardiovascular:     Rate and Rhythm: Normal rate and regular rhythm.     Pulses: Normal pulses.     Heart sounds: No murmur.  Pulmonary:     Effort: Pulmonary effort is normal. No respiratory distress.     Breath sounds: Normal breath sounds. No stridor. No wheezing, rhonchi or rales.  Chest:     Chest wall: No tenderness.  Musculoskeletal:     Thoracic back: She exhibits pain and spasm. She exhibits normal range of motion, no tenderness, no bony tenderness, no swelling, no edema, no deformity, no laceration and normal pulse.     Lumbar back: She exhibits no bony tenderness, no swelling, no edema, no deformity, no laceration, no pain and no spasm.       Back:  Skin:    General: Skin is warm.     Capillary Refill: Capillary refill takes less than 2 seconds.  Neurological:     Mental Status: She is alert.      Assessment & Plan:   Problem List Items Addressed This Visit    None    Visit Diagnoses    NSAID long-term use    -  Primary Will check renal function Continue nsaids for back pain for now   Relevant Orders   Comprehensive metabolic panel   CBC   Overweight    -  Pt does very physical work for her employment Will monitor lipids and  screen for diabetes    Relevant Orders   Comprehensive metabolic panel   Lipid panel   TSH   Hemoglobin A1c   Screening for deficiency anemia       Relevant Orders   CBC   Screening for  diabetes mellitus    -  Screening for diabetes today with a1c Will monitor as pt has family history of diabetes    Relevant Orders   Hemoglobin A1c   Screening, lipid       Prehypertension    -  Will monitor bp    Relevant Orders   Comprehensive metabolic panel   Lipid panel   History of anemia       Relevant Orders   CBC   Acute bilateral thoracic back pain    -  Advised voltaren, flexeril and ibuprofen Follow up pnr   Relevant Medications   ibuprofen (ADVIL) 800 MG tablet   cyclobenzaprine (FLEXERIL) 5 MG tablet      Outpatient Encounter Medications as of 10/04/2018  Medication Sig  . Acetaminophen-Guaifenesin (THERAFLU FLU/CHEST CONGESTION) 1000-400 MG PACK Take 1 packet by mouth once. Reported on 05/26/2015  . azithromycin (ZITHROMAX) 250 MG tablet Take one tab po qday x 3 more days, zero refills (Patient not taking: Reported on 10/04/2018)  . benzonatate (TESSALON) 100 MG capsule Take 1 capsule (100 mg total) by mouth 3 (three) times daily as needed for cough. (Patient not taking: Reported on 10/04/2018)  . cefUROXime (CEFTIN) 500 MG tablet Take 1 tablet (500 mg total) by mouth 2 (two) times daily with a meal. (Patient not taking: Reported on 10/04/2018)  . cyclobenzaprine (FLEXERIL) 5 MG tablet Take 1 tablet (5 mg total) by mouth 3 (three) times daily as needed for muscle spasms.  . diclofenac sodium (VOLTAREN) 1 % GEL Apply 2 g topically 4 (four) times daily.  Marland Kitchen. ibuprofen (ADVIL) 800 MG tablet Take 1 tablet (800 mg total) by mouth every 8 (eight) hours as needed.  . methocarbamol (ROBAXIN-750) 750 MG tablet Take 1 tablet (750 mg total) by mouth 4 (four) times daily. (Patient not taking: Reported on 10/04/2018)  . Phenyleph-Doxylamine-DM-APAP (ALKA SELTZER PLUS PO) Take 2 tablets by mouth every  4 (four) hours as needed (for cold). Reported on 05/26/2015  . predniSONE (STERAPRED UNI-PAK 21 TAB) 10 MG (21) TBPK tablet Take by mouth daily. Take 6 tabs by mouth daily  for 2 days, then 5 tabs for 2 days, then 4 tabs for 2 days, then 3 tabs for 2 days, 2 tabs for 2 days, then 1 tab by mouth daily for 2 days (Patient not taking: Reported on 10/04/2018)   No facility-administered encounter medications on file as of 10/04/2018.     Follow-up: No follow-ups on file.   Doristine BosworthZoe A Jamien Casanova, MD

## 2018-10-04 NOTE — Patient Instructions (Addendum)
   If you have lab work done today you will be contacted with your lab results within the next 2 weeks.  If you have not heard from us then please contact us. The fastest way to get your results is to register for My Chart.   IF you received an x-ray today, you will receive an invoice from [facility]. Please contact [facility] at [phone] with questions or concerns regarding your invoice.   IF you received labwork today, you will receive an invoice from [facility]. Please contact [facility] at [phone] with questions or concerns regarding your invoice.   Our billing staff will not be able to assist you with questions regarding bills from these companies.  You will be contacted with the lab results as soon as they are available. The fastest way to get your results is to activate your My Chart account. Instructions are located on the last page of this paperwork. If you have not heard from us regarding the results in 2 weeks, please contact this office.     Preventing Hypertension Hypertension, commonly called high blood pressure, is when the force of blood pumping through the arteries is too strong. Arteries are blood vessels that carry blood from the heart throughout the body. Over time, hypertension can damage the arteries and decrease blood flow to important parts of the body, including the brain, heart, and kidneys. Often, hypertension does not cause symptoms until blood pressure is very high. For this reason, it is important to have your blood pressure checked on a regular basis. Hypertension can often be prevented with diet and lifestyle changes. If you already have hypertension, you can control it with diet and lifestyle changes, as well as medicine. What nutrition changes can be made? Maintain a healthy diet. This includes:  Eating less salt (sodium). Ask your health care provider how much sodium is safe for you to have. The general recommendation is to consume  less than 1 tsp (2,300 mg) of sodium a day. ? Do not add salt to your food. ? Choose low-sodium options when grocery shopping and eating out.  Limiting fats in your diet. You can do this by eating low-fat or fat-free dairy products and by eating less red meat.  Eating more fruits, vegetables, and whole grains. Make a goal to eat: ? 1-2 cups of fresh fruits and vegetables each day. ? 3-4 servings of whole grains each day.  Avoiding foods and beverages that have added sugars.  Eating fish that contain healthy fats (omega-3 fatty acids), such as mackerel or salmon. If you need help putting together a healthy eating plan, try the DASH diet. This diet is high in fruits, vegetables, and whole grains. It is low in sodium, red meat, and added sugars. DASH stands for Dietary Approaches to Stop Hypertension. What lifestyle changes can be made?   Lose weight if you are overweight. Losing just 3?5% of your body weight can help prevent or control hypertension. ? For example, if your present weight is 200 lb (91 kg), a loss of 3-5% of your weight means losing 6-10 lb (2.7-4.5 kg). ? Ask your health care provider to help you with a diet and exercise plan to safely lose weight.  Get enough exercise. Do at least 150 minutes of moderate-intensity exercise each week. ? You could do this in short exercise sessions several times a day, or you could do longer exercise sessions a few times a week. For example, you could take a brisk 10-minute walk   or bike ride, 3 times a day, for 5 days a week.  Find ways to reduce stress, such as exercising, meditating, listening to music, or taking a yoga class. If you need help reducing stress, ask your health care provider.  Do not smoke. This includes e-cigarettes. Chemicals in tobacco and nicotine products raise your blood pressure each time you smoke. If you need help quitting, ask your health care provider.  Avoid alcohol. If you drink alcohol, limit alcohol intake to  no more than 1 drink a day for nonpregnant women and 2 drinks a day for men. One drink equals 12 oz of beer, 5 oz of wine, or 1 oz of hard liquor. Why are these changes important? Diet and lifestyle changes can help you prevent hypertension, and they may make you feel better overall and improve your quality of life. If you have hypertension, making these changes will help you control it and help prevent major complications, such as:  Hardening and narrowing of arteries that supply blood to: ? Your heart. This can cause a heart attack. ? Your brain. This can cause a stroke. ? Your kidneys. This can cause kidney failure.  Stress on your heart muscle, which can cause heart failure. What can I do to lower my risk?  Work with your health care provider to make a hypertension prevention plan that works for you. Follow your plan and keep all follow-up visits as told by your health care provider.  Learn how to check your blood pressure at home. Make sure that you know your personal target blood pressure, as told by your health care provider. How is this treated? In addition to diet and lifestyle changes, your health care provider may recommend medicines to help lower your blood pressure. You may need to try a few different medicines to find what works best for you. You also may need to take more than one medicine. Take over-the-counter and prescription medicines only as told by your health care provider. Where to find support Your health care provider can help you prevent hypertension and help you keep your blood pressure at a healthy level. Your local hospital or your community may also provide support services and prevention programs. The American Heart Association offers an online support network at: http://supportnetwork.heart.org/high-blood-pressure Where to find more information Learn more about hypertension from:  National Heart, Lung, and Blood Institute:  www.nhlbi.nih.gov/health/health-topics/topics/hbp  Centers for Disease Control and Prevention: www.cdc.gov/bloodpressure  American Academy of Family Physicians: http://familydoctor.org/familydoctor/en/diseases-conditions/high-blood-pressure.printerview.all.html Learn more about the DASH diet from:  National Heart, Lung, and Blood Institute: www.nhlbi.nih.gov/health/health-topics/topics/dash Contact a health care provider if:  You think you are having a reaction to medicines you have taken.  You have recurrent headaches or feel dizzy.  You have swelling in your ankles.  You have trouble with your vision. Summary  Hypertension often does not cause any symptoms until blood pressure is very high. It is important to get your blood pressure checked regularly.  Diet and lifestyle changes are the most important steps in preventing hypertension.  By keeping your blood pressure in a healthy range, you can prevent complications like heart attack, heart failure, stroke, and kidney failure.  Work with your health care provider to make a hypertension prevention plan that works for you. This information is not intended to replace advice given to you by your health care provider. Make sure you discuss any questions you have with your health care provider. Document Released: 03/11/2015 Document Revised: 06/18/2018 Document Reviewed: 11/05/2015 Elsevier Patient Education    2020 [employer]  

## 2018-10-05 LAB — COMPREHENSIVE METABOLIC PANEL
ALT: 11 IU/L (ref 0–32)
AST: 17 IU/L (ref 0–40)
Albumin/Globulin Ratio: 1.6 (ref 1.2–2.2)
Albumin: 4.5 g/dL (ref 3.8–4.8)
Alkaline Phosphatase: 72 IU/L (ref 39–117)
BUN/Creatinine Ratio: 19 (ref 9–23)
BUN: 14 mg/dL (ref 6–24)
Bilirubin Total: <0.2 mg/dL (ref 0.0–1.2)
CO2: 20 mmol/L (ref 20–29)
Calcium: 9.1 mg/dL (ref 8.7–10.2)
Chloride: 102 mmol/L (ref 96–106)
Creatinine, Ser: 0.75 mg/dL (ref 0.57–1.00)
GFR calc Af Amer: 109 mL/min/{1.73_m2} (ref >59)
GFR calc non Af Amer: 95 mL/min/{1.73_m2} (ref >59)
Globulin, Total: 2.9 g/dL (ref 1.5–4.5)
Glucose: 78 mg/dL (ref 65–99)
Potassium: 4.3 mmol/L (ref 3.5–5.2)
Sodium: 139 mmol/L (ref 134–144)
Total Protein: 7.4 g/dL (ref 6.0–8.5)

## 2018-10-05 LAB — CBC
Hematocrit: 35.2 % (ref 34.0–46.6)
Hemoglobin: 10.2 g/dL — ABNORMAL LOW (ref 11.1–15.9)
MCH: 21.9 pg — ABNORMAL LOW (ref 26.6–33.0)
MCHC: 29 g/dL — ABNORMAL LOW (ref 31.5–35.7)
MCV: 76 fL — ABNORMAL LOW (ref 79–97)
Platelets: 430 10*3/uL (ref 150–450)
RBC: 4.66 x10E6/uL (ref 3.77–5.28)
RDW: 19.7 % — ABNORMAL HIGH (ref 11.7–15.4)
WBC: 5.8 10*3/uL (ref 3.4–10.8)

## 2018-10-05 LAB — LIPID PANEL
Chol/HDL Ratio: 2 ratio (ref 0.0–4.4)
Cholesterol, Total: 146 mg/dL (ref 100–199)
HDL: 72 mg/dL (ref >39)
LDL Calculated: 59 mg/dL (ref 0–99)
Triglycerides: 73 mg/dL (ref 0–149)
VLDL Cholesterol Cal: 15 mg/dL (ref 5–40)

## 2018-10-05 LAB — HEMOGLOBIN A1C
Est. average glucose Bld gHb Est-mCnc: 105 mg/dL
Hgb A1c MFr Bld: 5.3 % (ref 4.8–5.6)

## 2018-10-05 LAB — TSH: TSH: 9.89 u[IU]/mL — ABNORMAL HIGH (ref 0.450–4.500)

## 2018-11-01 NOTE — Addendum Note (Signed)
Addended by: Delia Chimes A on: 11/01/2018 12:34 AM   Modules accepted: Orders

## 2018-11-08 ENCOUNTER — Encounter: Payer: Self-pay | Admitting: Radiology

## 2018-12-13 ENCOUNTER — Ambulatory Visit (INDEPENDENT_AMBULATORY_CARE_PROVIDER_SITE_OTHER): Payer: 59 | Admitting: Family Medicine

## 2018-12-13 ENCOUNTER — Encounter: Payer: Self-pay | Admitting: Family Medicine

## 2018-12-13 ENCOUNTER — Telehealth: Payer: Self-pay

## 2018-12-13 ENCOUNTER — Other Ambulatory Visit: Payer: Self-pay

## 2018-12-13 VITALS — BP 149/79 | HR 72 | Temp 98.6°F | Resp 17 | Ht 66.0 in | Wt 172.0 lb

## 2018-12-13 DIAGNOSIS — Z833 Family history of diabetes mellitus: Secondary | ICD-10-CM

## 2018-12-13 DIAGNOSIS — Z23 Encounter for immunization: Secondary | ICD-10-CM | POA: Diagnosis not present

## 2018-12-13 DIAGNOSIS — F321 Major depressive disorder, single episode, moderate: Secondary | ICD-10-CM

## 2018-12-13 DIAGNOSIS — R5383 Other fatigue: Secondary | ICD-10-CM

## 2018-12-13 DIAGNOSIS — H6123 Impacted cerumen, bilateral: Secondary | ICD-10-CM

## 2018-12-13 DIAGNOSIS — E559 Vitamin D deficiency, unspecified: Secondary | ICD-10-CM

## 2018-12-13 DIAGNOSIS — Z0001 Encounter for general adult medical examination with abnormal findings: Secondary | ICD-10-CM

## 2018-12-13 DIAGNOSIS — D509 Iron deficiency anemia, unspecified: Secondary | ICD-10-CM

## 2018-12-13 DIAGNOSIS — Z299 Encounter for prophylactic measures, unspecified: Secondary | ICD-10-CM | POA: Diagnosis not present

## 2018-12-13 DIAGNOSIS — R7989 Other specified abnormal findings of blood chemistry: Secondary | ICD-10-CM

## 2018-12-13 DIAGNOSIS — Z131 Encounter for screening for diabetes mellitus: Secondary | ICD-10-CM

## 2018-12-13 DIAGNOSIS — Z0000 Encounter for general adult medical examination without abnormal findings: Secondary | ICD-10-CM

## 2018-12-13 LAB — POCT URINALYSIS DIP (MANUAL ENTRY)
Bilirubin, UA: NEGATIVE
Blood, UA: NEGATIVE
Glucose, UA: NEGATIVE mg/dL
Ketones, POC UA: NEGATIVE mg/dL
Leukocytes, UA: NEGATIVE
Nitrite, UA: NEGATIVE
Protein Ur, POC: NEGATIVE mg/dL
Spec Grav, UA: 1.025 (ref 1.010–1.025)
Urobilinogen, UA: 0.2 E.U./dL
pH, UA: 6 (ref 5.0–8.0)

## 2018-12-13 NOTE — Progress Notes (Signed)
Chief Complaint  Patient presents with  . Annual Exam    no pap  . Medication Refill    ibuprofen -90 day supply    Subjective:  Penny Schultz is a 48 y.o. female here for a health maintenance visit.  Patient is established pt  Depression  She has brief thoughts that she would be better off daead She states that she "gets tired of working working"  "if I would be resting then I wouldn't have to be worried about this" She states "she would never act on it" She reports that she works for the [employer] and is mostly walking, pushing and pulling and reports that she has been having pain with her hip, neck and back. She reports that she gets physical therapy, chiropractor and counseling through her insurance.  She had some counseling with her job    Patient Active Problem List   Diagnosis Date Noted  . Streptococcal pneumonia ([place]) 03/27/2015  . Sepsis due to Streptococcus pneumoniae ([place]) 03/27/2015  . Acute renal failure (ARF) ([place]) 03/26/2015  . Microcytic anemia 03/26/2015    Past Medical History:  Diagnosis Date  . Blood transfusion without reported diagnosis     Past Surgical History:  Procedure Laterality Date  . open heart surgery 48 years old       Outpatient Medications Prior to Visit  Medication Sig Dispense Refill  . cyclobenzaprine (FLEXERIL) 5 MG tablet Take 1 tablet (5 mg total) by mouth 3 (three) times daily as needed for muscle spasms. 30 tablet 1  . diclofenac sodium (VOLTAREN) 1 % GEL Apply 2 g topically 4 (four) times daily. 100 g 6  . ibuprofen (ADVIL) 800 MG tablet Take 1 tablet (800 mg total) by mouth every 8 (eight) hours as needed. 30 tablet 0  . Phenyleph-Doxylamine-DM-APAP (ALKA SELTZER PLUS PO) Take 2 tablets by mouth every 4 (four) hours as needed (for cold). Reported on 05/26/2015    . Acetaminophen-Guaifenesin (THERAFLU FLU/CHEST CONGESTION) 1000-400 MG PACK Take 1 packet by mouth once. Reported on 05/26/2015    . azithromycin (ZITHROMAX)  250 MG tablet Take one tab po qday x 3 more days, zero refills (Patient not taking: Reported on 10/04/2018) 3 each 0  . benzonatate (TESSALON) 100 MG capsule Take 1 capsule (100 mg total) by mouth 3 (three) times daily as needed for cough. (Patient not taking: Reported on 10/04/2018) 20 capsule 0  . cefUROXime (CEFTIN) 500 MG tablet Take 1 tablet (500 mg total) by mouth 2 (two) times daily with a meal. (Patient not taking: Reported on 10/04/2018) 6 tablet 0  . methocarbamol (ROBAXIN-750) 750 MG tablet Take 1 tablet (750 mg total) by mouth 4 (four) times daily. (Patient not taking: Reported on 10/04/2018) 30 tablet 0  . predniSONE (STERAPRED UNI-PAK 21 TAB) 10 MG (21) TBPK tablet Take by mouth daily. Take 6 tabs by mouth daily  for 2 days, then 5 tabs for 2 days, then 4 tabs for 2 days, then 3 tabs for 2 days, 2 tabs for 2 days, then 1 tab by mouth daily for 2 days (Patient not taking: Reported on 10/04/2018) 42 tablet 0   No facility-administered medications prior to visit.     No Known Allergies   Family History  Problem Relation Age of Onset  . Diabetes Mother   . Hypertension Mother   . Diabetes Father   . Heart disease Father      Health Habits: Dental Exam: up to date Eye Exam: up to  date Exercise: 5 times/week on average by walking at work Current exercise activities: walking/running Diet:   Social History   Socioeconomic History  . Marital status: Single    Spouse name: Not on file  . Number of children: Not on file  . Years of education: Not on file  . Highest education level: Not on file  Occupational History  . Not on file  Social Needs  . Financial resource strain: Not on file  . Food insecurity    Worry: Not on file    Inability: Not on file  . Transportation needs    Medical: Not on file    Non-medical: Not on file  Tobacco Use  . Smoking status: Former Smoker    Packs/day: 1.00    Types: Cigarettes  . Smokeless tobacco: Never Used  Substance and Sexual  Activity  . Alcohol use: No    Alcohol/week: 0.0 standard drinks  . Drug use: Yes    Types: Marijuana  . Sexual activity: Not on file  Lifestyle  . Physical activity    Days per week: Not on file    Minutes per session: Not on file  . Stress: Not on file  Relationships  . Social Herbalist on phone: Not on file    Gets together: Not on file    Attends religious service: Not on file    Active member of club or organization: Not on file    Attends meetings of clubs or organizations: Not on file    Relationship status: Not on file  . Intimate partner violence    Fear of current or ex partner: Not on file    Emotionally abused: Not on file    Physically abused: Not on file    Forced sexual activity: Not on file  Other Topics Concern  . Not on file  Social History Narrative  . Not on file   Social History   Substance and Sexual Activity  Alcohol Use No  . Alcohol/week: 0.0 standard drinks   Social History   Tobacco Use  Smoking Status Former Smoker  . Packs/day: 1.00  . Types: Cigarettes  Smokeless Tobacco Never Used   Social History   Substance and Sexual Activity  Drug Use Yes  . Types: Marijuana    GYN: Sexual Health Menstrual status: regular menses LMP: Patient's last menstrual period was 12/05/2018. Last pap smear: see HM section History of abnormal pap smears: 12/06/2018   Health Maintenance: See under health Maintenance activity for review of completion dates as well. Immunization History  Administered Date(s) Administered  . Influenza,inj,Quad PF,6+ Mos 05/26/2015      Depression Screen-PHQ2/9 Depression screen [facility] 2/9 12/13/2018 10/04/2018 05/26/2015  Decreased Interest 2 0 0  Down, Depressed, Hopeless 1 0 0  PHQ - 2 Score 3 0 0  Altered sleeping 3 - -  Tired, decreased energy 2 - -  Change in appetite 1 - -  Feeling bad or failure about yourself  0 - -  Trouble concentrating 0 - -  Moving slowly or fidgety/restless 0 - -   Suicidal thoughts 1 - -  PHQ-9 Score 10 - -  Difficult doing work/chores Somewhat difficult - -       Depression Severity and Treatment Recommendations:  0-4= None  5-9= Mild / Treatment: Support, educate to call if worse; return in one month  10-14= Moderate / Treatment: Support, watchful waiting; Antidepressant or Psycotherapy  15-19= Moderately severe / Treatment: Antidepressant OR Psychotherapy  >=  20 = Major depression, severe / Antidepressant AND Psychotherapy    Review of Systems   Review of Systems  Constitutional: Negative for chills, fever and weight loss.  Eyes: Negative for blurred vision and double vision.  Respiratory: Negative for cough, shortness of breath and wheezing.   Cardiovascular: Negative for chest pain and palpitations.  Gastrointestinal: Negative for abdominal pain, nausea and vomiting.  Genitourinary: Negative for dysuria and urgency.  Neurological: Negative for dizziness and headaches.  Psychiatric/Behavioral: Positive for depression and suicidal ideas. Negative for hallucinations and substance abuse. The patient is not nervous/anxious and does not have insomnia.     See HPI for ROS as well.     Objective:   Vitals:   12/13/18 0937  BP: (!) 149/79  Pulse: 72  Resp: 17  Temp: 98.6 F (37 C)  TempSrc: Oral  SpO2: 100%  Weight: 172 lb (78 kg)  Height: _0  (1.676 m)    Body mass index is 27.76 kg/m.  Physical Exam Constitutional:      Appearance: Normal appearance.  HENT:     Head: Normocephalic and atraumatic.     Right Ear: There is impacted cerumen.     Left Ear: There is impacted cerumen.     Nose: Nose normal.  Eyes:     Extraocular Movements: Extraocular movements intact.     Conjunctiva/sclera: Conjunctivae normal.  Neck:     Musculoskeletal: Normal range of motion. No neck rigidity.     Comments: No thyromegaly Cardiovascular:     Rate and Rhythm: Normal rate and regular rhythm.     Pulses: Normal pulses.   Pulmonary:     Effort: Pulmonary effort is normal. No respiratory distress.     Breath sounds: Normal breath sounds. No stridor. No wheezing or rhonchi.  Abdominal:     General: Abdomen is flat. Bowel sounds are normal. There is no distension.     Palpations: Abdomen is soft. There is no mass.     Tenderness: There is no abdominal tenderness. There is no right CVA tenderness, left CVA tenderness, guarding or rebound.     Hernia: No hernia is present.  Musculoskeletal: Normal range of motion.        General: No swelling or tenderness.  Skin:    General: Skin is warm.     Capillary Refill: Capillary refill takes less than 2 seconds.     Findings: No erythema.  Neurological:     General: No focal deficit present.     Mental Status: She is alert and oriented to person, place, and time. Mental status is at baseline.     Cranial Nerves: No cranial nerve deficit.  Psychiatric:        Mood and Affect: Mood normal.        Behavior: Behavior normal.        Thought Content: Thought content normal.        Judgment: Judgment normal.        Assessment/Plan:   Patient was seen for a health maintenance exam.  Counseled the patient on health maintenance issues. Reviewed her health mainteance schedule and ordered appropriate tests (see orders.) Counseled on regular exercise and weight management. Recommend regular eye exams and dental cleaning.   The following issues were addressed today for health maintenance:   Penny Schultz was seen today for annual exam and medication refill.  Diagnoses and all orders for this visit:  Routine physical examination -     POCT urinalysis dipstick  Screening for  cervical cancer  Abnormal TSH -     TSH + free T4  Need for prophylactic measure -     Tdap vaccine greater than or equal to 7yo IM  Need for prophylactic vaccination and inoculation against influenza- completed at work and updated in the historical immunizations tab  Family history of diabetes  mellitus in mother -     Hemoglobin A1c  Microcytic anemia -     CBC  Other fatigue- could be anemia, could be her depression or both Will also screen for other factors -     CMP14+EGFR -     Hemoglobin A1c -     VITAMIN D 25 Hydroxy (Vit-D Deficiency, Fractures)  Vitamin D deficiency- discussed that since she is working indoors will check also cause cause fatigue -     VITAMIN D 25 Hydroxy (Vit-D Deficiency, Fractures)  Major depressive disorder, single episode, moderate (HCC)  Screening for diabetes mellitus- discussed that diabetes could also cause fatigue -     CMP14+EGFR -     Hemoglobin A1c  Bilateral impacted cerumen -     Ear wax removal    No follow-ups on file.    Body mass index is 27.76 kg/m.:  Discussed the patient's BMI with patient. The BMI body mass index is 27.76 kg/m.     No future appointments.  Patient Instructions       If you have lab work done today you will be contacted with your lab results within the next 2 weeks.  If you have not heard from [place] then please contact us. The fastest way to get your results is to register for My Chart.   IF you received an x-ray today, you will receive an invoice from [facility]. Please contact [facility] at [phone] with questions or concerns regarding your invoice.   IF you received labwork today, you will receive an invoice from [facility]. Please contact [facility] at [phone] with questions or concerns regarding your invoice.   Our billing staff will not be able to assist you with questions regarding bills from these companies.  You will be contacted with the lab results as soon as they are available. The fastest way to get your results is to activate your My Chart account. Instructions are located on the last page of this paperwork. If you have not heard from [place] regarding the results in 2 weeks, please contact this office.

## 2018-12-13 NOTE — Patient Instructions (Addendum)
If you have lab work done today you will be contacted with your lab results within the next 2 weeks.  If you have not heard from [place] then please contact us. The fastest way to get your results is to register for My Chart.   IF you received an x-ray today, you will receive an invoice from [facility]. Please contact [facility] at [phone] with questions or concerns regarding your invoice.   IF you received labwork today, you will receive an invoice from [facility]. Please contact [facility] at [phone] with questions or concerns regarding your invoice.   Our billing staff will not be able to assist you with questions regarding bills from these companies.  You will be contacted with the lab results as soon as they are available. The fastest way to get your results is to activate your My Chart account. Instructions are located on the last page of this paperwork. If you have not heard from [place] regarding the results in 2 weeks, please contact this office.     Stress Stress is a normal reaction to life events. Stress is what you feel when life demands more than you are used to, or more than you think you can handle. Some stress can be useful, such as studying for a test or meeting a deadline at work. Stress that occurs too often or for too long can cause problems. It can affect your emotional health and interfere with relationships and normal daily activities. Too much stress can weaken your body's defense system (immune system) and increase your risk for physical illness. If you already have a medical problem, stress can make it worse. What are the causes? All sorts of life events can cause stress. An event that causes stress for one person may not be stressful for another person. Major life events, whether positive or negative, commonly cause stress. Examples include:  Losing a job or starting a new job.  Losing a loved one.  Moving to a new town or home.  Getting  married or divorced.  Having a baby.  Injury or illness. Less obvious life events can also cause stress, especially if they occur day after day or in combination with each other. Examples include:  Working long hours.  Driving in traffic.  Caring for children.  Being in debt.  Being in a difficult relationship. What are the signs or symptoms? Stress can cause emotional symptoms, including:  Anxiety. This is feeling worried, afraid, on edge, overwhelmed, or out of control.  Anger, including irritation or impatience.  Depression. This is feeling sad, down, helpless, or guilty.  Trouble focusing, remembering, or making decisions. Stress can cause physical symptoms, including:  Aches and pains. These may affect your head, neck, back, stomach, or other areas of your body.  Tight muscles or a clenched jaw.  Low energy.  Trouble sleeping. Stress can cause unhealthy behaviors, including:  Eating to feel better (overeating) or skipping meals.  Working too much or putting off tasks.  Smoking, drinking alcohol, or using drugs to feel better. How is this diagnosed? Stress is diagnosed through an assessment by your health care provider. He or she may diagnose this condition based on:  Your symptoms and any stressful life events.  Your medical history.  Tests to rule out other causes of your symptoms. Depending on your condition, your health care provider may refer you to a specialist for further evaluation. How is this treated?  Stress management techniques are the recommended treatment  for stress. Medicine is not typically recommended for the treatment of stress. Techniques to reduce your reaction to stressful life events include:  Stress identification. Monitor yourself for symptoms of stress and identify what causes stress for you. These skills may help you to avoid or prepare for stressful events.  Time management. Set your priorities, keep a calendar of events, and  learn to say "no." Taking these actions can help you avoid making too many commitments. Techniques for coping with stress include:  Rethinking the problem. Try to think realistically about stressful events rather than ignoring them or overreacting. Try to find the positives in a stressful situation rather than focusing on the negatives.  Exercise. Physical exercise can release both physical and emotional tension. The key is to find a form of exercise that you enjoy and do it regularly.  Relaxation techniques. These relax the body and mind. The key is to find one or more that you enjoy and use the technique(s) regularly. Examples include: ? Meditation, deep breathing, or progressive relaxation techniques. ? Yoga or tai chi. ? Biofeedback, mindfulness techniques, or journaling. ? Listening to music, being out in nature, or participating in other hobbies.  Practicing a healthy lifestyle. Eat a balanced diet, drink plenty of water, limit or avoid caffeine, and get plenty of sleep.  Having a strong support network. Spend time with family, friends, or other people you enjoy being around. Express your feelings and talk things over with someone you trust. Counseling or talk therapy with a mental health professional may be helpful if you are having trouble managing stress on your own. Follow these instructions at home: Lifestyle   Avoid drugs.  Do not use any products that contain nicotine or tobacco, such as cigarettes and e-cigarettes. If you need help quitting, ask your health care provider.  Limit alcohol intake to no more than 1 drink a day for nonpregnant women and 2 drinks a day for men. One drink equals 12 oz of beer, 5 oz of wine, or 1 oz of hard liquor.  Do not use alcohol or drugs to relax.  Eat a balanced diet that includes fresh fruits and vegetables, whole grains, lean meats, fish, eggs, and beans, and low-fat dairy. Avoid processed foods and foods high in added fat, sugar, and  salt.  Exercise at least 30 minutes on 5 or more days each week.  Get 7-8 hours of sleep each night. General instructions   Practice stress management techniques as discussed with your health care provider.  Drink enough fluid to keep your urine clear or pale yellow.  Take over-the-counter and prescription medicines only as told by your health care provider.  Keep all follow-up visits as told by your health care provider. This is important. Contact a health care provider if:  Your symptoms get worse.  You have new symptoms.  You feel overwhelmed by your problems and can no longer manage them on your own. Get help right away if:  You have thoughts of hurting yourself or others. If you ever feel like you may hurt yourself or others, or have thoughts about taking your own life, get help right away. You can go to your nearest emergency department or call:  Your local emergency services (911 in the [place]  A suicide crisis helpline, such as the [facility] at 773-143-9585. This is open 24 hours a day. Summary  Stress is a normal reaction to life events. It can cause problems if it happens too often or  for too long.  Practicing stress management techniques is the best way to treat stress.  Counseling or talk therapy with a mental health professional may be helpful if you are having trouble managing stress on your own. This information is not intended to replace advice given to you by your health care provider. Make sure you discuss any questions you have with your health care provider. Document Released: 08/20/2000 Document Revised: 02/06/2017 Document Reviewed: 04/16/2016 Elsevier Patient Education  2020 [employer].

## 2018-12-14 LAB — CMP14+EGFR
ALT: 13 IU/L (ref 0–32)
AST: 22 IU/L (ref 0–40)
Albumin/Globulin Ratio: 1.5 (ref 1.2–2.2)
Albumin: 4.3 g/dL (ref 3.8–4.8)
Alkaline Phosphatase: 91 IU/L (ref 39–117)
BUN/Creatinine Ratio: 22 (ref 9–23)
BUN: 17 mg/dL (ref 6–24)
Bilirubin Total: <0.2 mg/dL (ref 0.0–1.2)
CO2: 21 mmol/L (ref 20–29)
Calcium: 9 mg/dL (ref 8.7–10.2)
Chloride: 101 mmol/L (ref 96–106)
Creatinine, Ser: 0.79 mg/dL (ref 0.57–1.00)
GFR calc Af Amer: 102 mL/min/{1.73_m2} (ref >59)
GFR calc non Af Amer: 88 mL/min/{1.73_m2} (ref >59)
Globulin, Total: 2.8 g/dL (ref 1.5–4.5)
Glucose: 85 mg/dL (ref 65–99)
Potassium: 4.3 mmol/L (ref 3.5–5.2)
Sodium: 136 mmol/L (ref 134–144)
Total Protein: 7.1 g/dL (ref 6.0–8.5)

## 2018-12-14 LAB — CBC
Hematocrit: 32.5 % — ABNORMAL LOW (ref 34.0–46.6)
Hemoglobin: 9.7 g/dL — ABNORMAL LOW (ref 11.1–15.9)
MCH: 23 pg — ABNORMAL LOW (ref 26.6–33.0)
MCHC: 29.8 g/dL — ABNORMAL LOW (ref 31.5–35.7)
MCV: 77 fL — ABNORMAL LOW (ref 79–97)
Platelets: 428 10*3/uL (ref 150–450)
RBC: 4.22 x10E6/uL (ref 3.77–5.28)
RDW: 16.5 % — ABNORMAL HIGH (ref 11.7–15.4)
WBC: 6.9 10*3/uL (ref 3.4–10.8)

## 2018-12-14 LAB — HEMOGLOBIN A1C
Est. average glucose Bld gHb Est-mCnc: 114 mg/dL
Hgb A1c MFr Bld: 5.6 % (ref 4.8–5.6)

## 2018-12-14 LAB — VITAMIN D 25 HYDROXY (VIT D DEFICIENCY, FRACTURES): Vit D, 25-Hydroxy: 21.1 ng/mL — ABNORMAL LOW (ref 30.0–100.0)

## 2018-12-14 LAB — TSH+FREE T4
Free T4: 0.92 ng/dL (ref 0.82–1.77)
TSH: 6.92 u[IU]/mL — ABNORMAL HIGH (ref 0.450–4.500)

## 2018-12-23 ENCOUNTER — Telehealth: Payer: Self-pay | Admitting: Family Medicine

## 2018-12-23 ENCOUNTER — Other Ambulatory Visit: Payer: Self-pay | Admitting: Family Medicine

## 2018-12-23 ENCOUNTER — Other Ambulatory Visit: Payer: Self-pay

## 2018-12-23 DIAGNOSIS — R7989 Other specified abnormal findings of blood chemistry: Secondary | ICD-10-CM

## 2018-12-23 DIAGNOSIS — E559 Vitamin D deficiency, unspecified: Secondary | ICD-10-CM

## 2018-12-23 DIAGNOSIS — D508 Other iron deficiency anemias: Secondary | ICD-10-CM

## 2018-12-23 MED ORDER — IBUPROFEN 800 MG PO TABS: 800.0000 mg | ORAL_TABLET | Freq: Three times a day (TID) | ORAL | 0 refills | Status: DC | PRN

## 2018-12-23 MED ORDER — VITAMIN D 50 MCG (2000 UT) PO CAPS: 1.0000 | ORAL_CAPSULE | Freq: Every day | ORAL | 3 refills | Status: DC

## 2018-12-23 MED ORDER — FERROUS SULFATE 324 (65 FE) MG PO TBEC: 1.0000 | DELAYED_RELEASE_TABLET | Freq: Two times a day (BID) | ORAL | 3 refills | Status: DC

## 2018-12-23 MED ORDER — DOCUSATE SODIUM 50 MG PO CAPS: 50.0000 mg | ORAL_CAPSULE | Freq: Two times a day (BID) | ORAL | 3 refills | Status: DC

## 2018-12-23 NOTE — Addendum Note (Signed)
Addended by: Delia Chimes A on: 12/23/2018 12:23 AM   Modules accepted: Orders

## 2018-12-23 NOTE — Telephone Encounter (Signed)
Pt has missed call from office. States no vm or name was left. Please call pt back in regards to lab results

## 2018-12-23 NOTE — Telephone Encounter (Signed)
Spoke to pt about her labs and new medication she need to pick up from the pharmacy. She verbalized understanding.

## 2019-03-07 NOTE — Telephone Encounter (Signed)
Entered in error

## 2019-12-15 ENCOUNTER — Encounter: Payer: 59 | Admitting: Family Medicine

## 2020-05-16 ENCOUNTER — Encounter: Payer: Self-pay | Admitting: Family Medicine

## 2020-05-16 ENCOUNTER — Ambulatory Visit: Payer: 59 | Admitting: Family Medicine

## 2020-05-16 ENCOUNTER — Other Ambulatory Visit: Payer: Self-pay

## 2020-05-16 VITALS — BP 120/70 | HR 85 | Temp 97.0°F | Ht 64.5 in | Wt 174.0 lb

## 2020-05-16 DIAGNOSIS — E559 Vitamin D deficiency, unspecified: Secondary | ICD-10-CM | POA: Diagnosis not present

## 2020-05-16 DIAGNOSIS — Z0000 Encounter for general adult medical examination without abnormal findings: Secondary | ICD-10-CM

## 2020-05-16 DIAGNOSIS — D508 Other iron deficiency anemias: Secondary | ICD-10-CM | POA: Diagnosis not present

## 2020-05-16 DIAGNOSIS — Z7689 Persons encountering health services in other specified circumstances: Secondary | ICD-10-CM

## 2020-05-16 DIAGNOSIS — Z13220 Encounter for screening for lipoid disorders: Secondary | ICD-10-CM

## 2020-05-16 DIAGNOSIS — R7989 Other specified abnormal findings of blood chemistry: Secondary | ICD-10-CM | POA: Diagnosis not present

## 2020-05-16 DIAGNOSIS — Z131 Encounter for screening for diabetes mellitus: Secondary | ICD-10-CM | POA: Diagnosis not present

## 2020-05-16 DIAGNOSIS — Z1211 Encounter for screening for malignant neoplasm of colon: Secondary | ICD-10-CM

## 2020-05-16 LAB — CBC
HCT: 28.9 % — ABNORMAL LOW (ref 36.0–46.0)
Hemoglobin: 9 g/dL — ABNORMAL LOW (ref 12.0–15.0)
MCHC: 31.1 g/dL (ref 30.0–36.0)
MCV: 67.8 fl — ABNORMAL LOW (ref 78.0–100.0)
Platelets: 505 10*3/uL — ABNORMAL HIGH (ref 150.0–400.0)
RBC: 4.27 Mil/uL (ref 3.87–5.11)
RDW: 19.2 % — ABNORMAL HIGH (ref 11.5–15.5)
WBC: 5.8 10*3/uL (ref 4.0–10.5)

## 2020-05-16 LAB — LIPID PANEL
Cholesterol: 139 mg/dL (ref 0–200)
HDL: 61.8 mg/dL (ref >39.00)
LDL Cholesterol: 64 mg/dL (ref 0–99)
NonHDL: 77.12
Total CHOL/HDL Ratio: 2
Triglycerides: 67 mg/dL (ref 0.0–149.0)
VLDL: 13.4 mg/dL (ref 0.0–40.0)

## 2020-05-16 LAB — BASIC METABOLIC PANEL
BUN: 14 mg/dL (ref 6–23)
CO2: 25 mEq/L (ref 19–32)
Calcium: 9.2 mg/dL (ref 8.4–10.5)
Chloride: 104 mEq/L (ref 96–112)
Creatinine, Ser: 0.89 mg/dL (ref 0.40–1.20)
GFR: 75.55 mL/min (ref >60.00)
Glucose, Bld: 86 mg/dL (ref 70–99)
Potassium: 4.1 mEq/L (ref 3.5–5.1)
Sodium: 137 mEq/L (ref 135–145)

## 2020-05-16 LAB — HEMOGLOBIN A1C: Hgb A1c MFr Bld: 6 % (ref 4.6–6.5)

## 2020-05-16 LAB — AST: AST: 13 U/L (ref 0–37)

## 2020-05-16 LAB — T4, FREE: Free T4: 0.79 ng/dL (ref 0.60–1.60)

## 2020-05-16 LAB — ALT: ALT: 9 U/L (ref 0–35)

## 2020-05-16 LAB — VITAMIN D 25 HYDROXY (VIT D DEFICIENCY, FRACTURES): VITD: 17.07 ng/mL — ABNORMAL LOW (ref 30.00–100.00)

## 2020-05-16 LAB — TSH: TSH: 3.02 u[IU]/mL (ref 0.35–4.50)

## 2020-05-16 NOTE — Patient Instructions (Signed)
Health Maintenance Due  Topic Date Due  . Hepatitis C Screening  Never done  . COVID-19 Vaccine (1) UTD on all 3. Never done  . COLONOSCOPY (Pts 45-31yrs Insurance coverage will need to be confirmed)  Patient would like a referral today. Never done    Depression screen [facility] 2/9 12/13/2018 10/04/2018 05/26/2015  Decreased Interest 2 0 0  Down, Depressed, Hopeless 1 0 0  PHQ - 2 Score 3 0 0  Altered sleeping 3 - -  Tired, decreased energy 2 - -  Change in appetite 1 - -  Feeling bad or failure about yourself  0 - -  Trouble concentrating 0 - -  Moving slowly or fidgety/restless 0 - -  Suicidal thoughts 1 - -  PHQ-9 Score 10 - -  Difficult doing work/chores Somewhat difficult - -

## 2020-05-16 NOTE — Progress Notes (Signed)
Penny Schultz is a 50 y.o. female  Chief Complaint  Patient presents with  . Establish Care    Np here to est care.  Pt is fasting for labs.  Pt has a GYN and UTD pap and mammogram.  Pt would like a referral for a colonoscopy.    HPI: Penny Schultz is a 50 y.o. female here today to establish care with our office and for annual CPE, labs. Previous PCP Dr. Creta Levin at [facility] [place].  Last PAP: UTD, follows with GYN - Dr. Dareen Piano Last mammo: UTD, follows with GYN Last colonoscopy: never, requests referral  Dental: UTD - last appt 2 wks Vision: has to schedule exam, wears glasses  Med refills needed today? n/a   Past Medical History:  Diagnosis Date  . Blood transfusion without reported diagnosis   B/L pneumonia in 2017 - 4 days in ICU  Past Surgical History:  Procedure Laterality Date  . LEEP  2007  . open heart surgery 50 years old      Social History   Socioeconomic History  . Marital status: Single    Spouse name: Not on file  . Number of children: Not on file  . Years of education: Not on file  . Highest education level: Not on file  Occupational History  . Not on file  Tobacco Use  . Smoking status: Former Smoker    Packs/day: 1.00    Types: Cigarettes  . Smokeless tobacco: Never Used  Substance and Sexual Activity  . Alcohol use: No    Alcohol/week: 0.0 standard drinks  . Drug use: Yes    Types: Marijuana  . Sexual activity: Not on file  Other Topics Concern  . Not on file  Social History Narrative  . Not on file   Social Determinants of Health   Financial Resource Strain: Not on file  Food Insecurity: Not on file  Transportation Needs: Not on file  Physical Activity: Not on file  Stress: Not on file  Social Connections: Not on file  Intimate Partner Violence: Not on file    Family History  Problem Relation Age of Onset  . Diabetes Mother   . Hypertension Mother   . Diabetes Father   . Heart disease Father       Immunization History  Administered Date(s) Administered  . Influenza,inj,Quad PF,6+ Mos 05/26/2015  . Influenza-Unspecified 12/06/2018  . Tdap 12/13/2018    Outpatient Encounter Medications as of 05/16/2020  Medication Sig Note  . Cholecalciferol (VITAMIN D) 50 MCG (2000 UT) CAPS Take 1 capsule (2,000 Units total) by mouth daily.   . cyclobenzaprine (FLEXERIL) 5 MG tablet Take 1 tablet (5 mg total) by mouth 3 (three) times daily as needed for muscle spasms.   Marland Kitchen ibuprofen (ADVIL) 800 MG tablet Take 1 tablet (800 mg total) by mouth every 8 (eight) hours as needed.   . docusate sodium (COLACE) 50 MG capsule Take 1 capsule (50 mg total) by mouth 2 (two) times daily. (Patient not taking: Reported on 05/16/2020)   . [DISCONTINUED] Acetaminophen-Guaifenesin 1000-400 MG PACK Take 1 packet by mouth once. Reported on 05/26/2015 (Patient not taking: Reported on 05/16/2020)   . [DISCONTINUED] azithromycin (ZITHROMAX) 250 MG tablet Take one tab po qday x 3 more days, zero refills (Patient not taking: No sig reported)   . [DISCONTINUED] benzonatate (TESSALON) 100 MG capsule Take 1 capsule (100 mg total) by mouth 3 (three) times daily as needed for cough. (Patient not taking: No  sig reported)   . [DISCONTINUED] cefUROXime (CEFTIN) 500 MG tablet Take 1 tablet (500 mg total) by mouth 2 (two) times daily with a meal. (Patient not taking: Reported on 10/04/2018)   . [DISCONTINUED] diclofenac sodium (VOLTAREN) 1 % GEL Apply 2 g topically 4 (four) times daily. (Patient not taking: Reported on 05/16/2020)   . [DISCONTINUED] ferrous sulfate 324 (65 Fe) MG TBEC Take 1 tablet (325 mg total) by mouth 2 (two) times daily. (Patient not taking: Reported on 05/16/2020)   . [DISCONTINUED] methocarbamol (ROBAXIN-750) 750 MG tablet Take 1 tablet (750 mg total) by mouth 4 (four) times daily. (Patient not taking: Reported on 10/04/2018)   . [DISCONTINUED] Phenyleph-Doxylamine-DM-APAP (ALKA SELTZER PLUS PO) Take 2 tablets by mouth every 4  (four) hours as needed (for cold). Reported on 05/26/2015 (Patient not taking: Reported on 05/16/2020) 12/13/2018: prn  . [DISCONTINUED] predniSONE (STERAPRED UNI-PAK 21 TAB) 10 MG (21) TBPK tablet Take by mouth daily. Take 6 tabs by mouth daily  for 2 days, then 5 tabs for 2 days, then 4 tabs for 2 days, then 3 tabs for 2 days, 2 tabs for 2 days, then 1 tab by mouth daily for 2 days (Patient not taking: Reported on 10/04/2018)    No facility-administered encounter medications on file as of 05/16/2020.     ROS: Gen: no fever, chills  Skin: no rash, itching ENT: no ear pain, ear drainage, nasal congestion, rhinorrhea, sinus pressure, sore throat Eyes: no blurry vision, double vision Resp: no cough, wheeze,SOB Breast: no breast tenderness, no nipple discharge, no breast masses CV: no CP, palpitations, LE edema,  GI: no heartburn, n/v/d/c, abd pain GU: no dysuria, urgency, frequency, hematuria; no vaginal itching, odor, discharge MSK: no joint pain, myalgias, back pain Neuro: no dizziness, headache, weakness, vertigo Psych: no depression, anxiety, insomnia   No Known Allergies  BP 120/70 (BP Location: Left Arm, Patient Position: Sitting, Cuff Size: Normal)   Pulse 85   Temp (!) 97 F (36.1 C) (Temporal)   Ht 5' 4.5" (1.638 m)   Wt 174 lb (78.9 kg)   LMP 05/14/2020   SpO2 100%   BMI 29.41 kg/m   Wt Readings from Last 3 Encounters:  05/16/20 174 lb (78.9 kg)  12/13/18 172 lb (78 kg)  10/04/18 171 lb (77.6 kg)   Temp Readings from Last 3 Encounters:  05/16/20 (!) 97 F (36.1 C) (Temporal)  12/13/18 98.6 F (37 C) (Oral)  10/04/18 98.8 F (37.1 C) (Oral)   BP Readings from Last 3 Encounters:  05/16/20 120/70  12/13/18 (!) 149/79  10/04/18 133/81   Pulse Readings from Last 3 Encounters:  05/16/20 85  12/13/18 72  10/04/18 77     Physical Exam Constitutional:      General: She is not in acute distress.    Appearance: She is well-developed and well-nourished.  HENT:      Head: Normocephalic and atraumatic.     Right Ear: Tympanic membrane and ear canal normal.     Left Ear: Tympanic membrane and ear canal normal.     Nose: Nose normal.     Mouth/Throat:     Mouth: Oropharynx is clear and moist and mucous membranes are normal. Mucous membranes are moist.     Pharynx: Oropharynx is clear.  Eyes:     Conjunctiva/sclera: Conjunctivae normal.  Neck:     Thyroid: No thyromegaly.  Cardiovascular:     Rate and Rhythm: Normal rate and regular rhythm.     Pulses:  Intact distal pulses.     Heart sounds: Normal heart sounds. No murmur heard.   Pulmonary:     Effort: Pulmonary effort is normal. No respiratory distress.     Breath sounds: Normal breath sounds. No wheezing or rhonchi.  Abdominal:     General: Bowel sounds are normal. There is no distension.     Palpations: Abdomen is soft. There is no mass.     Tenderness: There is no abdominal tenderness.  Musculoskeletal:        General: No edema.     Cervical back: Neck supple.     Right lower leg: No edema.     Left lower leg: No edema.  Lymphadenopathy:     Cervical: No cervical adenopathy.  Skin:    General: Skin is warm and dry.  Neurological:     Mental Status: She is alert and oriented to person, place, and time.     Motor: No abnormal muscle tone.     Coordination: Coordination normal.  Psychiatric:        Mood and Affect: Mood and affect and mood normal.        Behavior: Behavior normal.      A/P:  1. Encounter to establish care with new doctor  2. Annual physical exam - discussed importance of regular CV exercise, healthy diet, adequate sleep - UTD on PAP and mammo - needs colonoscopy referral and placed today - UTD on covid vaccines, declines flu - ALT - AST - Basic metabolic panel - CBC - next CPE in 1 year  3. Vitamin D deficiency - takes 2000IU daily - VITAMIN D 25 Hydroxy (Vit-D Deficiency, Fractures)  4. Other iron deficiency anemia - not on iron supplement - CBC -  Iron, TIBC and Ferritin Panel  5. Abnormal TSH - TSH - T4, free  6. Screening for diabetes mellitus - Basic metabolic panel - Hemoglobin A1c  7. Screening for lipid disorders - Lipid panel  8. Screening for colon cancer - Ambulatory referral to Gastroenterology    This visit occurred during the SARS-CoV-2 public health emergency.  Safety protocols were in place, including screening questions prior to the visit, additional usage of staff PPE, and extensive cleaning of exam room while observing appropriate contact time as indicated for disinfecting solutions.

## 2020-05-17 ENCOUNTER — Other Ambulatory Visit: Payer: Self-pay | Admitting: Family Medicine

## 2020-05-17 DIAGNOSIS — E559 Vitamin D deficiency, unspecified: Secondary | ICD-10-CM

## 2020-05-17 LAB — IRON,TIBC AND FERRITIN PANEL
%SAT: 3 % (calc) — ABNORMAL LOW (ref 16–45)
Ferritin: 4 ng/mL — ABNORMAL LOW (ref 16–232)
Iron: 13 ug/dL — ABNORMAL LOW (ref 45–160)
TIBC: 519 mcg/dL (calc) — ABNORMAL HIGH (ref 250–450)

## 2020-05-17 MED ORDER — VITAMIN D (ERGOCALCIFEROL) 1.25 MG (50000 UNIT) PO CAPS: 50000.0000 [IU] | ORAL_CAPSULE | ORAL | 3 refills | Status: DC

## 2020-10-16 ENCOUNTER — Other Ambulatory Visit: Payer: Self-pay | Admitting: Family Medicine

## 2020-10-16 DIAGNOSIS — E559 Vitamin D deficiency, unspecified: Secondary | ICD-10-CM

## 2020-12-11 ENCOUNTER — Ambulatory Visit (INDEPENDENT_AMBULATORY_CARE_PROVIDER_SITE_OTHER): Payer: 59 | Admitting: Nurse Practitioner

## 2020-12-11 ENCOUNTER — Encounter: Payer: Self-pay | Admitting: Nurse Practitioner

## 2020-12-11 ENCOUNTER — Other Ambulatory Visit: Payer: Self-pay

## 2020-12-11 VITALS — BP 136/74 | HR 82 | Temp 96.6°F | Ht 65.25 in | Wt 183.0 lb

## 2020-12-11 DIAGNOSIS — R739 Hyperglycemia, unspecified: Secondary | ICD-10-CM

## 2020-12-11 DIAGNOSIS — E559 Vitamin D deficiency, unspecified: Secondary | ICD-10-CM | POA: Diagnosis not present

## 2020-12-11 DIAGNOSIS — Z23 Encounter for immunization: Secondary | ICD-10-CM | POA: Diagnosis not present

## 2020-12-11 DIAGNOSIS — D500 Iron deficiency anemia secondary to blood loss (chronic): Secondary | ICD-10-CM

## 2020-12-11 LAB — CBC WITH DIFFERENTIAL/PLATELET
Basophils Absolute: 0 10*3/uL (ref 0.0–0.1)
Basophils Relative: 0.4 % (ref 0.0–3.0)
Eosinophils Absolute: 0.1 10*3/uL (ref 0.0–0.7)
Eosinophils Relative: 1.1 % (ref 0.0–5.0)
HCT: 28.9 % — ABNORMAL LOW (ref 36.0–46.0)
Hemoglobin: 8.6 g/dL — ABNORMAL LOW (ref 12.0–15.0)
Lymphocytes Relative: 35.3 % (ref 12.0–46.0)
Lymphs Abs: 2 10*3/uL (ref 0.7–4.0)
MCHC: 29.6 g/dL — ABNORMAL LOW (ref 30.0–36.0)
MCV: 64.6 fl — ABNORMAL LOW (ref 78.0–100.0)
Monocytes Absolute: 0.5 10*3/uL (ref 0.1–1.0)
Monocytes Relative: 8.4 % (ref 3.0–12.0)
Neutro Abs: 3.2 10*3/uL (ref 1.4–7.7)
Neutrophils Relative %: 54.8 % (ref 43.0–77.0)
Platelets: 404 10*3/uL — ABNORMAL HIGH (ref 150.0–400.0)
RBC: 4.48 Mil/uL (ref 3.87–5.11)
RDW: 19.4 % — ABNORMAL HIGH (ref 11.5–15.5)
WBC: 5.8 10*3/uL (ref 4.0–10.5)

## 2020-12-11 LAB — HEMOGLOBIN A1C: Hgb A1c MFr Bld: 6.1 % (ref 4.6–6.5)

## 2020-12-11 LAB — VITAMIN D 25 HYDROXY (VIT D DEFICIENCY, FRACTURES): VITD: 27.22 ng/mL — ABNORMAL LOW (ref 30.00–100.00)

## 2020-12-11 MED ORDER — IRON POLYSACCH CMPLX-B12-FA 150-0.025-1 MG PO CAPS: 1.0000 | ORAL_CAPSULE | Freq: Every day | ORAL | 1 refills | Status: DC

## 2020-12-11 NOTE — Patient Instructions (Addendum)
You need to schedule appt with GI for repeat colonoscopy: [phone]  Go to lab for blood draw  Prediabetes Eating Plan Prediabetes is a condition that causes blood sugar (glucose) levels to be higher than normal. This increases the risk for developing type 2 diabetes (type 2 diabetes mellitus). Working with a health care provider or nutrition specialist (dietitian) to make diet and lifestyle changes can help prevent the onset of diabetes. These changes may help you: Control your blood glucose levels. Improve your cholesterol levels. Manage your blood pressure. What are tips for following this plan? Reading food labels Read food labels to check the amount of fat, salt (sodium), and sugar in prepackaged foods. Avoid foods that have: Saturated fats. Trans fats. Added sugars. Avoid foods that have more than 300 milligrams (mg) of sodium per serving. Limit your sodium intake to less than 2,300 mg each day. Shopping Avoid buying pre-made and processed foods. Avoid buying drinks with added sugar. Cooking Cook with olive oil. Do not use butter, lard, or ghee. Bake, broil, grill, steam, or boil foods. Avoid frying. Meal planning  Work with your dietitian to create an eating plan that is right for you. This may include tracking how many calories you take in each day. Use a food diary, notebook, or mobile application to track what you eat at each meal. Consider following a Mediterranean diet. This includes: Eating several servings of fresh fruits and vegetables each day. Eating fish at least twice a week. Eating one serving each day of whole grains, beans, nuts, and seeds. Using olive oil instead of other fats. Limiting alcohol. Limiting red meat. Using nonfat or low-fat dairy products. Consider following a plant-based diet. This includes dietary choices that focus on eating mostly vegetables and fruit, grains, beans, nuts, and seeds. If you have high blood pressure, you may need to limit  your sodium intake or follow a diet such as the DASH (Dietary Approaches to Stop Hypertension) eating plan. The DASH diet aims to lower high blood pressure. Lifestyle Set weight loss goals with help from your health care team. It is recommended that most people with prediabetes lose 7% of their body weight. Exercise for at least 30 minutes 5 or more days a week. Attend a support group or seek support from a mental health counselor. Take over-the-counter and prescription medicines only as told by your health care provider. What foods are recommended? Fruits Berries. Bananas. Apples. Oranges. Grapes. Papaya. Mango. Pomegranate. Kiwi. Grapefruit. Cherries. Vegetables Lettuce. Spinach. Peas. Beets. Cauliflower. Cabbage. Broccoli. Carrots. Tomatoes. Squash. Eggplant. Herbs. Peppers. Onions. Cucumbers. Brussels sprouts. Grains Whole grains, such as whole-wheat or whole-grain breads, crackers, cereals, and pasta. Unsweetened oatmeal. Bulgur. Barley. Quinoa. Brown rice. Corn or whole-wheat flour tortillas or taco shells. Meats and other proteins Seafood. Poultry without skin. Lean cuts of pork and beef. Tofu. Eggs. Nuts. Beans. Dairy Low-fat or fat-free dairy products, such as yogurt, cottage cheese, and cheese. Beverages Water. Tea. Coffee. Sugar-free or diet soda. Seltzer water. Low-fat or nonfat milk. Milk alternatives, such as soy or almond milk. Fats and oils Olive oil. Canola oil. Sunflower oil. Grapeseed oil. Avocado. Walnuts. Sweets and desserts Sugar-free or low-fat pudding. Sugar-free or low-fat ice cream and other frozen treats. Seasonings and condiments Herbs. Sodium-free spices. Mustard. Relish. Low-salt, low-sugar ketchup. Low-salt, low-sugar barbecue sauce. Low-fat or fat-free mayonnaise. The items listed above may not be a complete list of recommended foods and beverages. Contact a dietitian for more information. What foods are not recommended? Fruits  Fruits canned with  syrup. Vegetables Canned vegetables. Frozen vegetables with butter or cream sauce. Grains Refined white flour and flour products, such as bread, pasta, snack foods, and cereals. Meats and other proteins Fatty cuts of meat. Poultry with skin. Breaded or fried meat. Processed meats. Dairy Full-fat yogurt, cheese, or milk. Beverages Sweetened drinks, such as iced tea and soda. Fats and oils Butter. Lard. Ghee. Sweets and desserts Baked goods, such as cake, cupcakes, pastries, cookies, and cheesecake. Seasonings and condiments Spice mixes with added salt. Ketchup. Barbecue sauce. Mayonnaise. The items listed above may not be a complete list of foods and beverages that are not recommended. Contact a dietitian for more information. Where to find more information American Diabetes Association: www.diabetes.org Summary You may need to make diet and lifestyle changes to help prevent the onset of diabetes. These changes can help you control blood sugar, improve cholesterol levels, and manage blood pressure. Set weight loss goals with help from your health care team. It is recommended that most people with prediabetes lose 7% of their body weight. Consider following a Mediterranean diet. This includes eating plenty of fresh fruits and vegetables, whole grains, beans, nuts, seeds, fish, and low-fat dairy, and using olive oil instead of other fats. This information is not intended to replace advice given to you by your health care provider. Make sure you discuss any questions you have with your health care provider. Document Revised: 05/26/2019 Document Reviewed: 05/26/2019 Elsevier Patient Education  2022 [employer].

## 2020-12-11 NOTE — Assessment & Plan Note (Signed)
Repeat Vit D Current use of 50000IU weekly

## 2020-12-11 NOTE — Progress Notes (Signed)
Subjective:  Patient ID: Penny Schultz, female    DOB: 06-27-1970  Age: 50 y.o. MRN: [id number]  CC: Annual Exam (TOC/Dr. 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 States when she gets off the fork lift to walk around she has some weakness and dizzy spells. Doy Mince and flu vaccine given today. )  HPI  Microcytic anemia Did not start oral iron supplement at recommended by previous pcp. Reports intermittent dizziness. She plans to schedule appt for colonoscopy. Denies any obvious GI bleed. Hx of endometriosis, s/p uterine ablation. Has irregular menstrual: every 20-28days, no menorrhagia, no clots. Under the care of Dr. Tenny Craw (GYN).  Sent oral iron supplement Repeat iron panel and cbc Advised to f/up with GYN  Hyperglycemia Repeat HgbA1c Advised about need for low carb and low fat diet. Limit exercise to low impact: walking 20-21mins a day due to anemia.  Vitamin D deficiency Repeat Vit D Current use of 50000IU weekly   Reviewed past Medical, Social and Family history today.  Outpatient Medications Prior to Visit  Medication Sig Dispense Refill   cyclobenzaprine (FLEXERIL) 5 MG tablet Take 1 tablet (5 mg total) by mouth 3 (three) times daily as needed for muscle spasms. 30 tablet 1   ibuprofen (ADVIL) 800 MG tablet Take 1 tablet (800 mg total) by mouth every 8 (eight) hours as needed. 30 tablet 0   Vitamin D, Ergocalciferol, (DRISDOL) 1.25 MG (50000 UNIT) CAPS capsule Take 1 capsule (50,000 Units total) by mouth every 7 (seven) days. 5 capsule 3   Cholecalciferol (VITAMIN D) 50 MCG (2000 UT) CAPS Take 1 capsule (2,000 Units total) by mouth daily. (Patient not taking: Reported on 12/11/2020) 30 capsule 3   No facility-administered medications prior to visit.    ROS See HPI  Objective:  BP 136/74 (BP Location: Left Arm, Patient Position: Sitting, Cuff Size: Large)   Pulse 82   Temp (!)  96.6 F (35.9 C) (Temporal)   Ht 5' 5.25" (1.657 m)   Wt 183 lb (83 kg)   SpO2 99%   BMI 30.22 kg/m   Physical Exam Vitals reviewed.  Cardiovascular:     Rate and Rhythm: Normal rate and regular rhythm.     Pulses: Normal pulses.     Heart sounds: Normal heart sounds.  Pulmonary:     Effort: Pulmonary effort is normal.     Breath sounds: Normal breath sounds.  Musculoskeletal:     Cervical back: Normal range of motion and neck supple.     Right lower leg: No edema.     Left lower leg: No edema.  Neurological:     Mental Status: She is alert and oriented to person, place, and time.  Psychiatric:        Mood and Affect: Mood normal.        Behavior: Behavior normal.        Thought Content: Thought content normal.    Assessment & Plan:  This visit occurred during the SARS-CoV-2 public health emergency.  Safety protocols were in place, including screening questions prior to the visit, additional usage of staff PPE, and extensive cleaning of exam room while observing appropriate contact time as indicated for disinfecting solutions.   [name] was seen today for annual exam.  Diagnoses and all orders for this visit:  Vitamin D deficiency -     VITAMIN D 25 Hydroxy (Vit-D Deficiency, Fractures)  Hyperglycemia -     Hemoglobin A1c  Iron deficiency anemia due to chronic blood loss -     Iron, TIBC and Ferritin Panel -     CBC with Differential/Platelet -     Iron Polysacch Cmplx-B12-FA 150-0.025-1 MG CAPS; Take 1 tablet by mouth daily at 12 noon. With food  Flu vaccine need -     Flu Vaccine QUAD 6+ mos PF IM (Fluarix Quad PF)  Need for shingles vaccine -     Varicella-zoster vaccine IM  Problem List Items Addressed This Visit       Other   Hyperglycemia    Repeat HgbA1c Advised about need for low carb and low fat diet. Limit exercise to low impact: walking 20-41mins a day due to anemia.      Relevant Orders   Hemoglobin A1c   Microcytic anemia    Did not start  oral iron supplement at recommended by previous pcp. Reports intermittent dizziness. She plans to schedule appt for colonoscopy. Denies any obvious GI bleed. Hx of endometriosis, s/p uterine ablation. Has irregular menstrual: every 20-28days, no menorrhagia, no clots. Under the care of Dr. Tenny Craw (GYN).  Sent oral iron supplement Repeat iron panel and cbc Advised to f/up with GYN      Relevant Medications   Iron Polysacch Cmplx-B12-FA 150-0.025-1 MG CAPS   Vitamin D deficiency - Primary    Repeat Vit D Current use of 50000IU weekly      Relevant Orders   VITAMIN D 25 Hydroxy (Vit-D Deficiency, Fractures)   Other Visit Diagnoses     Flu vaccine need       Relevant Orders   Flu Vaccine QUAD 6+ mos PF IM (Fluarix Quad PF) (Completed)   Need for shingles vaccine       Relevant Orders   Varicella-zoster vaccine IM (Completed)        Follow-up: Return in about 6 months (around 06/11/2021) for CPE (fasting).  Alysia Penna, NP

## 2020-12-11 NOTE — Assessment & Plan Note (Addendum)
Did not start oral iron supplement at recommended by previous pcp. Reports intermittent dizziness. She plans to schedule appt for colonoscopy. Denies any obvious GI bleed. Hx of endometriosis, s/p uterine ablation. Has irregular menstrual: every 20-28days, no menorrhagia, no clots. Under the care of Dr. Tenny Craw (GYN).  Sent oral iron supplement Repeat iron panel and cbc Advised to f/up with GYN

## 2020-12-11 NOTE — Assessment & Plan Note (Signed)
Repeat HgbA1c Advised about need for low carb and low fat diet. Limit exercise to low impact: walking 20-34mins a day due to anemia.

## 2020-12-12 LAB — IRON,TIBC AND FERRITIN PANEL
%SAT: 4 % (calc) — ABNORMAL LOW (ref 16–45)
Ferritin: 4 ng/mL — ABNORMAL LOW (ref 16–232)
Iron: 21 ug/dL — ABNORMAL LOW (ref 45–160)
TIBC: 553 mcg/dL (calc) — ABNORMAL HIGH (ref 250–450)

## 2020-12-12 MED ORDER — VITAMIN D (ERGOCALCIFEROL) 1.25 MG (50000 UNIT) PO CAPS: 50000.0000 [IU] | ORAL_CAPSULE | ORAL | 2 refills | Status: DC

## 2020-12-12 NOTE — Addendum Note (Signed)
Addended by: Alysia Penna L on: 12/12/2020 02:58 PM   Modules accepted: Orders

## 2021-03-31 ENCOUNTER — Other Ambulatory Visit: Payer: Self-pay | Admitting: Nurse Practitioner

## 2021-03-31 DIAGNOSIS — E559 Vitamin D deficiency, unspecified: Secondary | ICD-10-CM

## 2021-06-11 ENCOUNTER — Encounter: Payer: 59 | Admitting: Nurse Practitioner

## 2021-06-11 ENCOUNTER — Ambulatory Visit: Payer: 59 | Admitting: Nurse Practitioner

## 2021-06-11 ENCOUNTER — Encounter: Payer: Self-pay | Admitting: Nurse Practitioner

## 2021-06-11 VITALS — BP 148/70 | HR 88 | Temp 97.4°F | Ht 65.25 in | Wt 184.6 lb

## 2021-06-11 DIAGNOSIS — F331 Major depressive disorder, recurrent, moderate: Secondary | ICD-10-CM

## 2021-06-11 DIAGNOSIS — F339 Major depressive disorder, recurrent, unspecified: Secondary | ICD-10-CM | POA: Insufficient documentation

## 2021-06-11 DIAGNOSIS — N921 Excessive and frequent menstruation with irregular cycle: Secondary | ICD-10-CM | POA: Diagnosis not present

## 2021-06-11 DIAGNOSIS — E559 Vitamin D deficiency, unspecified: Secondary | ICD-10-CM

## 2021-06-11 DIAGNOSIS — D509 Iron deficiency anemia, unspecified: Secondary | ICD-10-CM

## 2021-06-11 DIAGNOSIS — D500 Iron deficiency anemia secondary to blood loss (chronic): Secondary | ICD-10-CM | POA: Diagnosis not present

## 2021-06-11 DIAGNOSIS — R739 Hyperglycemia, unspecified: Secondary | ICD-10-CM | POA: Diagnosis not present

## 2021-06-11 DIAGNOSIS — R030 Elevated blood-pressure reading, without diagnosis of hypertension: Secondary | ICD-10-CM

## 2021-06-11 LAB — CBC WITH DIFFERENTIAL/PLATELET
Basophils Absolute: 0 10*3/uL (ref 0.0–0.1)
Basophils Relative: 0.3 % (ref 0.0–3.0)
Eosinophils Absolute: 0.1 10*3/uL (ref 0.0–0.7)
Eosinophils Relative: 1.4 % (ref 0.0–5.0)
HCT: 32.8 % — ABNORMAL LOW (ref 36.0–46.0)
Hemoglobin: 10.4 g/dL — ABNORMAL LOW (ref 12.0–15.0)
Lymphocytes Relative: 35.6 % (ref 12.0–46.0)
Lymphs Abs: 2.3 10*3/uL (ref 0.7–4.0)
MCHC: 31.5 g/dL (ref 30.0–36.0)
MCV: 72.1 fl — ABNORMAL LOW (ref 78.0–100.0)
Monocytes Absolute: 0.6 10*3/uL (ref 0.1–1.0)
Monocytes Relative: 8.8 % (ref 3.0–12.0)
Neutro Abs: 3.5 10*3/uL (ref 1.4–7.7)
Neutrophils Relative %: 53.9 % (ref 43.0–77.0)
Platelets: 369 10*3/uL (ref 150.0–400.0)
RBC: 4.55 Mil/uL (ref 3.87–5.11)
RDW: 19.8 % — ABNORMAL HIGH (ref 11.5–15.5)
WBC: 6.5 10*3/uL (ref 4.0–10.5)

## 2021-06-11 LAB — VITAMIN D 25 HYDROXY (VIT D DEFICIENCY, FRACTURES): VITD: 43.39 ng/mL (ref 30.00–100.00)

## 2021-06-11 LAB — BASIC METABOLIC PANEL
BUN: 15 mg/dL (ref 6–23)
CO2: 27 mEq/L (ref 19–32)
Calcium: 9.2 mg/dL (ref 8.4–10.5)
Chloride: 101 mEq/L (ref 96–112)
Creatinine, Ser: 0.82 mg/dL (ref 0.40–1.20)
GFR: 82.73 mL/min (ref >60.00)
Glucose, Bld: 92 mg/dL (ref 70–99)
Potassium: 3.9 mEq/L (ref 3.5–5.1)
Sodium: 135 mEq/L (ref 135–145)

## 2021-06-11 LAB — HEMOGLOBIN A1C: Hgb A1c MFr Bld: 6.2 % (ref 4.6–6.5)

## 2021-06-11 NOTE — Assessment & Plan Note (Signed)
She reports worsening anxiety since 2019 due to work stress. She is required to work overtime. This has led to emotional outburst, poor sleep, dizziness, SOB, increase to marijuana and ETOH use. She is unable to use vacation time because she will be penalized. 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 Her normal hours prior to 2019: 7.5hrs per day , 3rd shift, Sun-thurs. Mandatory overtime since 2019: 7days a week, 7.5-12hrs per day. ?Marijuana use 1-2x/day. ?ETOH: 1-2beer/day during the week and 4shots on weekend. ? ?She will like FMLA form complete so she can focus on her health and eliminate negative habits. ?

## 2021-06-11 NOTE — Progress Notes (Signed)
? ?             Established Patient Visit ? ?Patient: Penny Schultz   DOB: 11/09/1970   50 y.o. Female  MRN: [id number] ?Visit Date: 06/11/2021 ? ?Subjective:  ?  ?Chief Complaint  ?Patient presents with  ? Follow-up  ?  Follow up on dizzy spells and low iron. Pt has FMLA forms she needs completed.   ? ?HPI ?Microcytic anemia ?Secondary to menorrhagia, hx of uterine fibriods, hx of blood transfusion, ?Reports fatigue, dizziness, and SOB ?Exacerbated by increase work hours and decrease rest. ?Menstrual cycle: irregular: Dec (7days, clots and cramping), no bleeding in Jan and Feb, March (light bleeding x 2weeks). ?She is not interested in hysterectomy ?last pelvic US 2021, last PAP and mammogram in 2021 (normal per patient) [facility] per patient (requested records). Hx of LEEP in 2007. ?FHx of uterine fibroids (mother, Aunts, MGM) ?No FHx of cervical or uterine or ovarian or breast cancer. ? ?Check CBC and iron panel ?Maintain oral iron supplement ? ?Hyperglycemia ?Repeat hgbA1c ? ?Elevated BP without diagnosis of hypertension ?Advised to maintain DASh diet, minimize ETOH use, eliminate marijuana use. ?BP Readings from Last 3 Encounters:  ?06/11/21 (!) 148/70  ?12/11/20 136/74  ?05/16/20 120/70  ? ?F/up in 71month ? ?Vitamin D deficiency ?Repeat vit. D ? ?Depression, recurrent (HCC) ?She reports worsening anxiety since 2019 due to work stress. She is required to work overtime. This has led to emotional outburst, poor sleep, dizziness, SOB, increase to marijuana and ETOH use. She is unable to use vacation time because she will be penalized. 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 Her normal hours prior to 2019: 7.5hrs per day , 3rd shift, Sun-thurs. Mandatory overtime since 2019: 7days a week, 7.5-12hrs per day. ?Marijuana use 1-2x/day. ?ETOH: 1-2beer/day during the week and 4shots on weekend. ? ?She will like FMLA form complete so she can focus on her health and eliminate negative  habits. ? ? ?  06/11/2021  ?  2:47 PM 12/11/2020  ?  9:01 AM 05/16/2020  ? 10:24 AM  ?Depression screen PHQ 2/9  ?Decreased Interest 1 2 0  ?Down, Depressed, Hopeless 2 0 0  ?PHQ - 2 Score 3 2 0  ?Altered sleeping 2 0 2  ?Tired, decreased energy 3 0 2  ?Change in appetite 0 1 2  ?Feeling bad or failure about yourself  1 0 0  ?Trouble concentrating 0 0 0  ?Moving slowly or fidgety/restless 0 0 0  ?Suicidal thoughts 0 0 0  ?PHQ-9 Score 9 3 6   ?Difficult doing work/chores Somewhat difficult Not difficult at all Somewhat difficult  ?  ? ?  06/11/2021  ?  2:47 PM 12/11/2020  ?  9:01 AM 05/16/2020  ? 10:25 AM  ?GAD 7 : Generalized Anxiety Score  ?Nervous, Anxious, on Edge 2 1 0  ?Control/stop worrying 2 0 2  ?Worry too much - different things 2 1 2   ?Trouble relaxing 3 0 0  ?Restless 1 0 0  ?Easily annoyed or irritable 3 2 2   ?Afraid - awful might happen 3 0 0  ?Total GAD 7 Score 16 4 6   ?Anxiety Difficulty Very difficult Not difficult at all Not difficult at all  ? ?Reviewed medical, surgical, and social history today ? ?Medications: ?Outpatient Medications Prior to Visit  ?Medication Sig  ? cyclobenzaprine (FLEXERIL) 5 MG tablet Take 1 tablet (5 mg total) by mouth 3 (three) times daily as needed for muscle  spasms.  ? ibuprofen (ADVIL) 800 MG tablet Take 1 tablet (800 mg total) by mouth every 8 (eight) hours as needed.  ? Iron Polysacch Cmplx-B12-FA 150-0.025-1 MG CAPS Take 1 tablet by mouth daily at 12 noon. With food  ? Vitamin D, Ergocalciferol, (DRISDOL) 1.25 MG (50000 UNIT) CAPS capsule TAKE 1 CAPSULE BY MOUTH EVERY 7 DAYS  ? ?No facility-administered medications prior to visit.  ? ?Reviewed past medical and social history.  ? ?ROS per HPI above ? ? ?   ?Objective:  ?BP (!) 148/70 (BP Location: Left Arm, Patient Position: Sitting, Cuff Size: Normal)   Pulse 88   Temp (!) 97.4 ?F (36.3 ?C) (Temporal)   Ht 5' 5.25" (1.657 m)   Wt 184 lb 9.6 oz (83.7 kg)   SpO2 97%   BMI 30.48 kg/m?  ? ?  ? ?Physical Exam ?Vitals reviewed.   ?Cardiovascular:  ?   Rate and Rhythm: Normal rate and regular rhythm.  ?   Pulses: Normal pulses.  ?   Heart sounds: Normal heart sounds.  ?Pulmonary:  ?   Effort: Pulmonary effort is normal.  ?   Breath sounds: Normal breath sounds.  ?Musculoskeletal:  ?   Right lower leg: No edema.  ?   Left lower leg: No edema.  ?Neurological:  ?   Mental Status: She is alert and oriented to person, place, and time.  ?Psychiatric:     ?   Attention and Perception: Attention normal.     ?   Mood and Affect: Mood is anxious.     ?   Speech: Speech normal.     ?   Behavior: Behavior normal.     ?   Thought Content: Thought content normal.     ?   Cognition and Memory: Cognition normal.  ?  ?No results found for any visits on 06/11/21. ?   ?Assessment & Plan:  ?  ?Problem List Items Addressed This Visit   ? ?  ? Other  ? Depression, recurrent (HCC)  ?  She reports worsening anxiety since 2019 due to work stress. She is required to work overtime. This has led to emotional outburst, poor sleep, dizziness, SOB, increase to marijuana and ETOH use. She is unable to use vacation time because she will be penalized. 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 Her normal hours prior to 2019: 7.5hrs per day , 3rd shift, Sun-thurs. Mandatory overtime since 2019: 7days a week, 7.5-12hrs per day. ?Marijuana use 1-2x/day. ?ETOH: 1-2beer/day during the week and 4shots on weekend. ? ?She will like FMLA form complete so she can focus on her health and eliminate negative habits. ?  ?  ? Elevated BP without diagnosis of hypertension  ?  Advised to maintain DASh diet, minimize ETOH use, eliminate marijuana use. ?BP Readings from Last 3 Encounters:  ?06/11/21 (!) 148/70  ?12/11/20 136/74  ?05/16/20 120/70  ? ?F/up in 80month ?  ?  ? Hyperglycemia  ?  Repeat hgbA1c ?  ?  ? Relevant Orders  ? Basic metabolic panel  ? Hemoglobin A1c  ? Microcytic anemia - Primary  ?  Secondary to menorrhagia, hx of uterine fibriods, hx of blood  transfusion, ?Reports fatigue, dizziness, and SOB ?Exacerbated by increase work hours and decrease rest. ?Menstrual cycle: irregular: Dec (7days, clots and cramping), no bleeding in Jan and Feb, March (light bleeding x 2weeks). ?She is not interested in hysterectomy ?last pelvic US 2021, last PAP and mammogram in 2021 (  normal per patient) by [facility] per patient (requested records). Hx of LEEP in 2007. ?FHx of uterine fibroids (mother, Aunts, MGM) ?No FHx of cervical or uterine or ovarian or breast cancer. ? ?Check CBC and iron panel ?Maintain oral iron supplement ?  ?  ? Relevant Orders  ? CBC with Differential/Platelet  ? Iron, TIBC and Ferritin Panel  ? Vitamin D deficiency  ?  Repeat vit. D ?  ?  ? Relevant Orders  ? Vitamin D (25 hydroxy)  ? ?Other Visit Diagnoses   ? ? Menorrhagia with irregular cycle      ? Relevant Orders  ? CBC with Differential/Platelet  ? Iron, TIBC and Ferritin Panel  ? ?  ? ?Return in about 4 weeks (around 07/09/2021) for CPE (fasting). ? ?  ? ?Alysia Penna, NP ? ? ? ?

## 2021-06-11 NOTE — Patient Instructions (Addendum)
Sign medical release to get records from [facility]. ? ?Your form will be completed and faxed. ? ?Go to lab for blood draw ? ?Alcohol Intoxication ?Alcohol intoxication happens when you cannot think clearly or function well (get impaired) after drinking alcohol. This can happen after just one drink. The effect that alcohol has on how you think and function depends on: ?How much alcohol you drank. ?Your age, your weight, and whether you are a man or a woman. ?How often you drink alcohol. ?If you have other medical problems. ?Alcohol intoxication can range from mild to very bad. It can be dangerous, especially if you: ?Drink a large amount of alcohol in a short time (binge drink). ?For women, binge drinking is having four or more drinks at one time. ?For men, binge drinking is having five or more drinks at one time. ?Take certain drugs or medicines. ?If you or anyone around you seems intoxicated: ?Tell someone. ?Get help from someone. ?Follow these instructions at home: ?Eating and drinking ? ?Ask your doctor if alcohol is safe for you. ?If your doctor says that alcohol is safe for you, limit how much you drink to no more than 1 drink a day for women who are not pregnant and 2 drinks a day for men. One drink equals one of these: ?12 oz of beer. ?5 oz of wine. ?1? oz of hard liquor. ?Do not drink alcohol if: ?Your doctor tells you not to drink. ?You are pregnant, may be pregnant, or are planning to get pregnant. ?You are under the legal drinking age (52 years old in the [place] ?You are taking medicines that you should not take with alcohol. ?Alcohol causes your medical problem to get worse. ?You have to drive or do activities that need you to be alert. ?You have substance use disorder. This is when using alcohol again and again causes problems with your health, your relationships, or with what you need to do at work, home, or school. ?Be sure to eat before you drink alcohol. Avoid drinking when you have an  empty stomach. ?Make sure you have enough fluid in your body (stay hydrated). To do this: ?Drink enough fluid to keep your pee (urine) pale yellow. ?Avoid caffeine, which may be in coffee, tea, and some sodas. Caffeine can make you thirsty. ?Try not to drink more than one drink an hour. ?If you are having more than one drink, have a drink without alcohol (such as water) between your drinks. ?General instructions ? ?Take over-the-counter and prescription medicines only as told by your doctor. ?Do not drive after drinking any amount of alcohol. Plan for a designated driver or another way to go home. ?Have someone you trust stay with you while you are intoxicated. Youshould not be left alone. ?Keep all follow-up visits as told by your doctor. This is important. ?Contact a doctor if: ?You do not feel better after a few days. ?You have problems at work, at school, or at home due to drinking. ?Get help right away if: ?You have any of the following: ?Moderate or very bad trouble with: ?Movement (coordination). ?Talking. ?Memory. ?Paying attention to things. ?Trouble staying awake. ?Being very confused. ?Jerky movements that you cannot control (seizure). ?Light-headedness. ?Fainting. ?Throwing up (vomiting) blood. The blood may be bright red or look like coffee grounds. ?Blood in your poop (stool). The blood may: ?Be bright red. ?Make your poop black and tarry and make it smell bad. ?Feeling shaky when you try to stop drinking. ?Thoughts  about hurting yourself or others. ?If you ever feel like you may hurt yourself or others, or have thoughts about taking your own life, get help right away. You can go to your nearest emergency department or call: ?Your local emergency services (911 in the [place] ?A suicide crisis helpline, such as the National Suicide Prevention Lifeline at 4587166304. This is open 24 hours a day. ?Summary ?Alcohol intoxication happens when you cannot think clearly or function well (get impaired) after  drinking alcohol. This can happen after just one drink. ?If your doctor says that alcohol is safe for you, limit how much you drink to no more than 1 drink a day for women who are not pregnant and 2 drinks a day for men. ?Contact a doctor if you have problems at work, at school, or at home due to drinking. ?Get help right away if you have thoughts about hurting yourself or others. ?This information is not intended to replace advice given to you by your health care provider. Make sure you discuss any questions you have with your health care provider. ?Document Revised: 07/28/2018 Document Reviewed: 06/16/2017 ?Elsevier Patient Education ? 2022 Elsevier Inc. ? ?

## 2021-06-11 NOTE — Assessment & Plan Note (Signed)
Repeat hgbA1c 

## 2021-06-11 NOTE — Assessment & Plan Note (Signed)
Repeat vit. D °

## 2021-06-11 NOTE — Assessment & Plan Note (Addendum)
Advised to maintain DASh diet, minimize ETOH use, eliminate marijuana use. ?BP Readings from Last 3 Encounters:  ?06/11/21 (!) 148/70  ?12/11/20 136/74  ?05/16/20 120/70  ? ?F/up in 70month ?

## 2021-06-11 NOTE — Assessment & Plan Note (Signed)
Secondary to menorrhagia, hx of uterine fibriods, hx of blood transfusion, ?Reports fatigue, dizziness, and SOB ?Exacerbated by increase work hours and decrease rest. ?Menstrual cycle: irregular: Dec (7days, clots and cramping), no bleeding in Jan and Feb, March (light bleeding x 2weeks). ?She is not interested in hysterectomy ?last pelvic US 2021, last PAP and mammogram in 2021 (normal per patient) by [facility] per patient (requested records). Hx of LEEP in 2007. ?FHx of uterine fibroids (mother, Aunts, MGM) ?No FHx of cervical or uterine or ovarian or breast cancer. ? ?Check CBC and iron panel ?Maintain oral iron supplement ?

## 2021-06-12 ENCOUNTER — Telehealth: Payer: Self-pay | Admitting: Hematology and Oncology

## 2021-06-12 LAB — IRON,TIBC AND FERRITIN PANEL
%SAT: 11 % (calc) — ABNORMAL LOW (ref 16–45)
Ferritin: 4 ng/mL — ABNORMAL LOW (ref 16–232)
Iron: 55 ug/dL (ref 45–160)
TIBC: 499 mcg/dL (calc) — ABNORMAL HIGH (ref 250–450)

## 2021-06-12 NOTE — Addendum Note (Signed)
Addended by: Michaela Corner on: 06/12/2021 08:24 AM ? ? Modules accepted: Orders ? ?

## 2021-06-12 NOTE — Telephone Encounter (Signed)
Scheduled appt per 4/5 referral. Pt is aware of appt date and time. Pt is aware to arrive 15 mins prior to appt time and to bring and updated insurance card. Pt is aware of appt location.   ?

## 2021-06-27 ENCOUNTER — Telehealth: Payer: Self-pay

## 2021-06-27 NOTE — Telephone Encounter (Signed)
TC to patient to confirm new patient apt for 06/28/21. Directions provided. ?

## 2021-06-28 ENCOUNTER — Telehealth: Payer: Self-pay | Admitting: Pharmacy Technician

## 2021-06-28 ENCOUNTER — Other Ambulatory Visit: Payer: Self-pay

## 2021-06-28 ENCOUNTER — Inpatient Hospital Stay: Payer: 59 | Attending: Hematology and Oncology | Admitting: Hematology and Oncology

## 2021-06-28 ENCOUNTER — Inpatient Hospital Stay: Payer: 59

## 2021-06-28 VITALS — BP 141/45 | HR 72 | Temp 97.9°F | Resp 17 | Ht 65.25 in | Wt 189.1 lb

## 2021-06-28 DIAGNOSIS — Z800 Family history of malignant neoplasm of digestive organs: Secondary | ICD-10-CM | POA: Insufficient documentation

## 2021-06-28 DIAGNOSIS — D500 Iron deficiency anemia secondary to blood loss (chronic): Secondary | ICD-10-CM | POA: Insufficient documentation

## 2021-06-28 DIAGNOSIS — Z87891 Personal history of nicotine dependence: Secondary | ICD-10-CM | POA: Diagnosis not present

## 2021-06-28 DIAGNOSIS — N920 Excessive and frequent menstruation with regular cycle: Secondary | ICD-10-CM | POA: Diagnosis present

## 2021-06-28 NOTE — Progress Notes (Signed)
?[facility] ?[phone]   [phone] ? ?INITIAL CONSULT NOTE ? ?Patient Care Team: ?Nche, Bonna Gains, NP as PCP - General (Internal Medicine) ? ?Hematological/Oncological History ?# Iron Deficiency Anemia in the Setting of GYN Bleeding ?09/29/2008: WBC 7.6, Hgb 13.7, MCV 89.5, Plt 321 ?03/26/2015: WBC 7.1, Hgb 9.6, MCV 71.6, Plt 364 ?06/11/2021: WBC 6.5, Hgb 10.4, MCV 72.1, Plt 369. Iron 55, ferritin 4, iron sat 11%, TIBC 499.  ?06/28/2021: establish care with Dr. Leonides Schanz   ? ?CHIEF COMPLAINTS/PURPOSE OF CONSULTATION:  ?"Iron Deficiency Anemia " ? ?HISTORY OF PRESENTING ILLNESS:  ?Penny Schultz 51 y.o. female with medical history significant for heavy menstrual bleeding who presents for evaluation of iron deficiency anemia.  ? ?On review of the previous records Penny Schultz has had a normal hemoglobin on 09/29/2008 at which time her hemoglobin was 13.7.  Dating back as far as 03/26/2015 the patient had a hemoglobin of 9.6.  She appears to have been iron deficiency anemic since that time.  Most recently on 06/11/2021 the patient had a platelets of 6.5, hemoglobin 0.4, MCV 72.1, and platelets of 369.  Her ferritin was 4 and her iron sat was 11%.  It has been stable over the last year despite taking p.o. iron therapy.  Due to concern for these findings the patient was referred to hematology for further evaluation management. ? ?On exam today Penny Schultz is accompanied by her mother.  She reports that her main concern is that she has been having dizzy spells.  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  One time this happened she had to be taken to the medic onsite at her facility.  She reports that she also was not getting proper rest and has poor appetite and forces herself to eat.  She notes unfortunately her work schedule is every single day working third shift.  She is premenopausal and her cycles are regular and are not as heavy as they used to  be.  She notes she has not had a heavy one in over 1 year.  She is currently taking iron pills and tolerating well without any stomach upset or constipation.  She is also doing her best to increase iron rich foods in diet including cabbage and greens.  She denies any trouble with constipation or stomach upset.  She also notes she is not bleeding from any other sources at this time. ? ?On further discussion she notes her family history has no hematological diseases such as sickle cell or thalassemia.  Her paternal grandfather passed away of rectal cancer and no other major medical conditions in the family.  Patient is a former smoker having quit 7 years ago and she drinks alcohol "a couple times a week".  She notes that she currently works as a Estate agent she notes that she is not having any issues with shortness of breath or ice cravings.  Her primary symptom has been dizziness.  She notes that she has a card to call for scheduled for a colonoscopy.  She otherwise denies any fevers, chills, sweats, nausea vomiting or diarrhea.  Full 10 point ROS is listed below. ? ?MEDICAL HISTORY:  ?Past Medical History:  ?Diagnosis Date  ? Blood transfusion without reported diagnosis   ? ? ?SURGICAL HISTORY: ?Past Surgical History:  ?Procedure Laterality Date  ? LEEP  2007  ? open heart surgery 51 years old    ? uterine ablation    ? ? ?  SOCIAL HISTORY: ?Social History  ? ?Socioeconomic History  ? Marital status: Single  ?  Spouse name: Not on file  ? Number of children: 0  ? Years of education: Not on file  ? Highest education level: Not on file  ?Occupational History  ? Not on file  ?Tobacco Use  ? Smoking status: Former  ?  Packs/day: 1.00  ?  Types: Cigarettes  ?  Quit date: 03/13/2015  ?  Years since quitting: 6.2  ? Smokeless tobacco: Never  ?Vaping Use  ? Vaping Use: Never used  ?Substance and Sexual Activity  ? Alcohol use: Yes  ?  Alcohol/week: 6.0 standard drinks  ?  Types: 2 Cans of beer, 4 Shots of liquor per week   ? Drug use: Yes  ?  Frequency: 7.0 times per week  ?  Types: Marijuana  ? Sexual activity: Yes  ?  Birth control/protection: None  ?Other Topics Concern  ? Not on file  ?Social History Narrative  ? Not on file  ? ?Social Determinants of Health  ? ?Financial Resource Strain: Not on file  ?Food Insecurity: Not on file  ?Transportation Needs: Not on file  ?Physical Activity: Not on file  ?Stress: Not on file  ?Social Connections: Not on file  ?Intimate Partner Violence: Not on file  ? ? ?FAMILY HISTORY: ?Family History  ?Problem Relation Age of Onset  ? Diabetes Mother   ? Hypertension Mother   ? Diabetes Father   ? Heart disease Father   ? ? ?ALLERGIES:  has No Known Allergies. ? ?MEDICATIONS:  ?Current Outpatient Medications  ?Medication Sig Dispense Refill  ? ibuprofen (ADVIL) 800 MG tablet Take 1 tablet (800 mg total) by mouth every 8 (eight) hours as needed. 30 tablet 0  ? Iron Polysacch Cmplx-B12-FA 150-0.025-1 MG CAPS Take 1 tablet by mouth daily at 12 noon. With food 90 capsule 1  ? Vitamin D, Ergocalciferol, (DRISDOL) 1.25 MG (50000 UNIT) CAPS capsule TAKE 1 CAPSULE BY MOUTH EVERY 7 DAYS 4 capsule 2  ? ?No current facility-administered medications for this visit.  ? ? ?REVIEW OF SYSTEMS:   ?Constitutional: ( - ) fevers, ( - )  chills , ( - ) night sweats ?Eyes: ( - ) blurriness of vision, ( - ) double vision, ( - ) watery eyes ?Ears, nose, mouth, throat, and face: ( - ) mucositis, ( - ) sore throat ?Respiratory: ( - ) cough, ( - ) dyspnea, ( - ) wheezes ?Cardiovascular: ( - ) palpitation, ( - ) chest discomfort, ( - ) lower extremity swelling ?Gastrointestinal:  ( - ) nausea, ( - ) heartburn, ( - ) change in bowel habits ?Skin: ( - ) abnormal skin rashes ?Lymphatics: ( - ) new lymphadenopathy, ( - ) easy bruising ?Neurological: ( - ) numbness, ( - ) tingling, ( - ) new weaknesses ?Behavioral/Psych: ( - ) mood change, ( - ) new changes  ?All other systems were reviewed with the patient and are  negative. ? ?PHYSICAL EXAMINATION: ? ?Vitals:  ? 06/28/21 0847  ?BP: (!) 141/45  ?Pulse: 72  ?Resp: 17  ?Temp: 97.9 ?F (36.6 ?C)  ?SpO2: 100%  ? ?Filed Weights  ? 06/28/21 0847  ?Weight: 189 lb 1.6 oz (85.8 kg)  ? ? ?GENERAL: well appearing middle-aged African-American female in NAD  ?SKIN: skin color, texture, turgor are normal, no rashes or significant lesions ?EYES: conjunctiva are pink and non-injected, sclera clear ?LUNGS: clear to auscultation and percussion with normal breathing  effort ?HEART: regular rate & rhythm and no murmurs and no lower extremity edema ?Musculoskeletal: no cyanosis of digits and no clubbing  ?PSYCH: alert & oriented x 3, fluent speech ?NEURO: no focal motor/sensory deficits ? ?LABORATORY DATA:  ?I have reviewed the data as listed ? ?  Latest Ref Rng & Units 06/11/2021  ?  2:09 PM 12/11/2020  ?  9:33 AM 05/16/2020  ? 10:13 AM  ?CBC  ?WBC 4.0 - 10.5 K/uL 6.5   5.8   5.8    ?Hemoglobin 12.0 - 15.0 g/dL 09.9   8.6   9.0    ?Hematocrit 36.0 - 46.0 % 32.8   28.9   28.9    ?Platelets 150.0 - 400.0 K/uL 369.0   404.0   505.0    ? ? ? ?  Latest Ref Rng & Units 06/11/2021  ?  2:09 PM 05/16/2020  ? 10:13 AM 12/13/2018  ? 10:14 AM  ?CMP  ?Glucose 70 - 99 mg/dL 92   86   85    ?BUN 6 - 23 mg/dL 15   14   17     ?Creatinine 0.40 - 1.20 mg/dL   8.33   8.25    ?Sodium 135 - 145 mEq/L 135   137   136    ?Potassium 3.5 - 5.1 mEq/L 3.9   4.1   4.3    ?Chloride 96 - 112 mEq/L 101   104   101    ?CO2 19 - 32 mEq/L 27   25   21     ?Calcium 8.4 - 10.5 mg/dL 9.2   9.2   9.0    ?Total Protein 6.0 - 8.5 g/dL   7.1    ?Total Bilirubin 0.0 - 1.2 mg/dL   0.53    ?Alkaline Phos 39 - 117 IU/L   91    ?AST 0 - 37 U/L  13   22    ?ALT 0 - 35 U/L  9   13    ? ? ? ?ASSESSMENT & PLAN ? 51 y.o. female with medical history significant for heavy menstrual bleeding who presents for evaluation of iron deficiency anemia.  ? ?After review of the labs, review of the records, and discussion with the patient the patients  findings are most consistent with iron deficiency anemia secondary to GYN bleeding.  The patient does not have as heavy bleeding anymore but she does continue to have sporadic and irregular periods.  She notes that she

## 2021-06-28 NOTE — Telephone Encounter (Signed)
Dr. Leonides Schanz, ?Fyi note: ? ?Auth Submission: MONOFERRIC DENIED ?Payer: [employer] ?Medication & CPT/J Code(s) submitted: MONOFERRIC ?Route of submission (phone, fax, portal): PORTAL ?Auth type: Buy/Bill ?Units/visits requested: X1 DOSE ?Denied due to patient has not tried and or failed step therapy. ?Venofer ?Infed ?Ferrlecit ? ?Would you like to try venofer??? ?Please advise ? ? ?  ?

## 2021-07-01 ENCOUNTER — Other Ambulatory Visit: Payer: Self-pay | Admitting: Pharmacy Technician

## 2021-07-19 ENCOUNTER — Ambulatory Visit (INDEPENDENT_AMBULATORY_CARE_PROVIDER_SITE_OTHER): Payer: 59

## 2021-07-19 VITALS — BP 129/75 | HR 74 | Temp 98.7°F | Resp 16 | Ht 66.0 in | Wt 186.0 lb

## 2021-07-19 DIAGNOSIS — D500 Iron deficiency anemia secondary to blood loss (chronic): Secondary | ICD-10-CM

## 2021-07-19 MED ORDER — SODIUM CHLORIDE 0.9 % IV SOLN
200.0000 mg | Freq: Once | INTRAVENOUS | Status: AC
  Administered 2021-07-19: 200 mg via INTRAVENOUS
  Filled 2021-07-19: qty 10

## 2021-07-19 NOTE — Progress Notes (Signed)
Diagnosis: Iron Deficiency Anemia ? ?Provider:  Chilton Greathouse, MD ? ?Procedure: Infusion ? ?IV Type: Peripheral, IV Location: R Forearm ? ?Venofer (Iron Sucrose), Dose: 200 mg ? ?Infusion Start Time: 1017 ? ?Infusion Stop Time: 1035 ? ?Post Infusion IV Care: Observation period completed and Peripheral IV Discontinued ? ?Discharge: Condition: Good, Destination: Home . AVS provided to patient.  ? ?Performed by:  Loney Hering, LPN  ?  ?

## 2021-07-26 ENCOUNTER — Ambulatory Visit (INDEPENDENT_AMBULATORY_CARE_PROVIDER_SITE_OTHER): Payer: 59

## 2021-07-26 VITALS — BP 122/75 | HR 66 | Temp 98.5°F | Resp 18 | Ht 66.0 in | Wt 188.0 lb

## 2021-07-26 DIAGNOSIS — D500 Iron deficiency anemia secondary to blood loss (chronic): Secondary | ICD-10-CM

## 2021-07-26 MED ORDER — SODIUM CHLORIDE 0.9 % IV SOLN
200.0000 mg | Freq: Once | INTRAVENOUS | Status: AC
  Administered 2021-07-26: 200 mg via INTRAVENOUS
  Filled 2021-07-26: qty 10

## 2021-07-26 NOTE — Progress Notes (Signed)
Diagnosis: Iron Deficiency Anemia  Provider:  Marshell Garfinkel, MD  Procedure: Infusion  IV Type: Peripheral, IV Location: R Forearm  Entyvio (Vedolizumab), Venofer (Iron Sucrose), Dose: 200 mg  Infusion Start Time: [id number]  Infusion Stop Time: [id number]  Post Infusion IV Care: Peripheral IV Discontinued  Discharge: Condition: Good, Destination: Home . AVS provided to patient.   Performed by:  Cleophus Molt, RN

## 2021-07-31 ENCOUNTER — Ambulatory Visit (INDEPENDENT_AMBULATORY_CARE_PROVIDER_SITE_OTHER): Payer: 59 | Admitting: Nurse Practitioner

## 2021-07-31 ENCOUNTER — Encounter: Payer: Self-pay | Admitting: Nurse Practitioner

## 2021-07-31 VITALS — BP 124/74 | HR 74 | Temp 96.8°F | Ht 66.0 in | Wt 186.0 lb

## 2021-07-31 DIAGNOSIS — Z136 Encounter for screening for cardiovascular disorders: Secondary | ICD-10-CM | POA: Diagnosis not present

## 2021-07-31 DIAGNOSIS — Z23 Encounter for immunization: Secondary | ICD-10-CM

## 2021-07-31 DIAGNOSIS — Z13220 Encounter for screening for lipoid disorders: Secondary | ICD-10-CM

## 2021-07-31 DIAGNOSIS — Z8371 Family history of colonic polyps: Secondary | ICD-10-CM

## 2021-07-31 DIAGNOSIS — Z1211 Encounter for screening for malignant neoplasm of colon: Secondary | ICD-10-CM | POA: Diagnosis not present

## 2021-07-31 DIAGNOSIS — Z0001 Encounter for general adult medical examination with abnormal findings: Secondary | ICD-10-CM

## 2021-07-31 DIAGNOSIS — D500 Iron deficiency anemia secondary to blood loss (chronic): Secondary | ICD-10-CM | POA: Diagnosis not present

## 2021-07-31 LAB — LIPID PANEL
Cholesterol: 142 mg/dL (ref 0–200)
HDL: 59.4 mg/dL (ref >39.00)
LDL Cholesterol: 68 mg/dL (ref 0–99)
NonHDL: 82.48
Total CHOL/HDL Ratio: 2
Triglycerides: 70 mg/dL (ref 0.0–149.0)
VLDL: 14 mg/dL (ref 0.0–40.0)

## 2021-07-31 MED ORDER — IRON POLYSACCH CMPLX-B12-FA 150-0.025-1 MG PO CAPS: 1.0000 | ORAL_CAPSULE | Freq: Every day | ORAL | 3 refills | Status: DC

## 2021-07-31 NOTE — Progress Notes (Signed)
Complete physical exam  Patient: Penny Schultz   DOB: 01-12-1971   51 y.o. Female  MRN: [id number] Visit Date: 07/31/2021  Subjective:    Chief Complaint  Patient presents with   Annual Exam    CPE Pt not fasting Shingles vaccine given today No other concerns   Penny Schultz is a 51 y.o. female who presents today for a complete physical exam. She reports consuming a general diet.  Walking and elliptical 1x/week  She generally feels well. She reports sleeping well. She does not have additional problems to discuss today.  Vision:No Dental:Within Last 6 months STD Screen:No  Most recent fall risk assessment:    06/11/2021    1:06 PM  [place] in the past year? 0  Number falls in past yr: 0  Injury with Fall? 0  Risk for fall due to : No Fall Risks  Follow up Falls evaluation completed   Most recent depression screenings:    07/31/2021    8:55 AM 06/11/2021    2:47 PM  PHQ 2/9 Scores  PHQ - 2 Score 1 3  PHQ- 9 Score 4 9      07/31/2021    8:55 AM 06/11/2021    2:47 PM 12/11/2020    9:01 AM  Depression screen PHQ 2/9  Decreased Interest 1 1 2   Down, Depressed, Hopeless 0 2 0  PHQ - 2 Score 1 3 2   Altered sleeping 0 2 0  Tired, decreased energy 1 3 0  Change in appetite 2 0 1  Feeling bad or failure about yourself  0 1 0  Trouble concentrating 0 0 0  Moving slowly or fidgety/restless 0 0 0  Suicidal thoughts 0 0 0  PHQ-9 Score 4 9 3   Difficult doing work/chores Not difficult at all Somewhat difficult Not difficult at all       07/31/2021    8:55 AM 06/11/2021    2:47 PM 12/11/2020    9:01 AM 05/16/2020   10:25 AM  GAD 7 : Generalized Anxiety Score  Nervous, Anxious, on Edge 0 2 1 0  Control/stop worrying 1 2 0 2  Worry too much - different things 1 2 1 2   Trouble relaxing 0 3 0 0  Restless 0 1 0 0  Easily annoyed or irritable 2 3 2 2   Afraid - awful might happen 0 3 0 0  Total GAD 7 Score 4 16 4 6   Anxiety Difficulty Somewhat difficult Very difficult  Not difficult at all Not difficult at all   HPI  No problem-specific Assessment & Plan notes found for this encounter.   Past Medical History:  Diagnosis Date   Blood transfusion without reported diagnosis    Past Surgical History:  Procedure Laterality Date   LEEP  2007   open heart surgery 51 years old     uterine ablation     Social History   Socioeconomic History   Marital status: Single    Spouse name: Not on file   Number of children: 0   Years of education: Not on file   Highest education level: Not on file  Occupational History   Not on file  Tobacco Use   Smoking status: Former    Packs/day: 1.00    Types: Cigarettes    Quit date: 03/13/2015    Years since quitting: 6.3   Smokeless tobacco: Never  Vaping Use   Vaping Use: Never used  Substance and Sexual  Activity   Alcohol use: Yes    Alcohol/week: 6.0 standard drinks    Types: 2 Cans of beer, 4 Shots of liquor per week    Comment: daily   Drug use: Yes    Frequency: 7.0 times per week    Types: Marijuana   Sexual activity: Yes    Birth control/protection: None  Other Topics Concern   Not on file  Social History Narrative   Not on file   Social Determinants of Health   Financial Resource Strain: Not on file  Food Insecurity: Not on file  Transportation Needs: Not on file  Physical Activity: Not on file  Stress: Not on file  Social Connections: Not on file  Intimate Partner Violence: Not on file   Family Status  Relation Name Status   Mother  Alive   Father  Deceased   Family History  Problem Relation Age of Onset   Diabetes Mother    Hypertension Mother    Diabetes Father    Heart disease Father    No Known Allergies  Patient Care Team: Austine Wiedeman, Charlene Brooke, NP as PCP - General (Internal Medicine)   Medications: Outpatient Medications Prior to Visit  Medication Sig   ibuprofen (ADVIL) 800 MG tablet Take 1 tablet (800 mg total) by mouth every 8 (eight) hours as needed.   Vitamin D,  Ergocalciferol, (DRISDOL) 1.25 MG (50000 UNIT) CAPS capsule TAKE 1 CAPSULE BY MOUTH EVERY 7 DAYS   [DISCONTINUED] Iron Polysacch Cmplx-B12-FA 150-0.025-1 MG CAPS Take 1 tablet by mouth daily at 12 noon. With food   No facility-administered medications prior to visit.   Review of Systems  Constitutional:  Negative for fever.  HENT:  Negative for congestion and sore throat.   Eyes:        Negative for visual changes  Respiratory:  Negative for cough and shortness of breath.   Cardiovascular:  Negative for chest pain, palpitations and leg swelling.  Gastrointestinal:  Negative for blood in stool, constipation and diarrhea.  Genitourinary:  Negative for dysuria, frequency and urgency.  Musculoskeletal:  Negative for myalgias.  Skin:  Negative for rash.  Neurological:  Negative for dizziness and headaches.  Hematological:  Does not bruise/bleed easily.  Psychiatric/Behavioral:  Negative for suicidal ideas. The patient is not nervous/anxious.        Objective:  BP 124/74 (BP Location: Right Arm, Patient Position: Sitting, Cuff Size: Small)   Pulse 74   Temp (!) 96.8 F (36 C) (Temporal)   Ht 5\' 6"  (1.676 m)   Wt 186 lb (84.4 kg)   SpO2 99%   BMI 30.02 kg/m     BP Readings from Last 3 Encounters:  07/31/21 124/74  07/26/21 122/75  07/19/21 129/75   Wt Readings from Last 3 Encounters:  07/31/21 186 lb (84.4 kg)  07/26/21 188 lb (85.3 kg)  07/19/21 186 lb (84.4 kg)   Physical Exam Vitals reviewed.  Constitutional:      General: She is not in acute distress.    Appearance: She is well-developed.  HENT:     Right Ear: External ear normal. There is impacted cerumen.     Left Ear: Tympanic membrane, ear canal and external ear normal.     Nose: Nose normal.  Eyes:     Extraocular Movements: Extraocular movements intact.     Conjunctiva/sclera: Conjunctivae normal.  Cardiovascular:     Rate and Rhythm: Normal rate and regular rhythm.     Pulses: Normal pulses.  Heart  sounds: Normal heart sounds.  Pulmonary:     Effort: Pulmonary effort is normal. No respiratory distress.     Breath sounds: Normal breath sounds.  Chest:     Chest wall: No tenderness.  Abdominal:     General: Bowel sounds are normal.     Palpations: Abdomen is soft.  Genitourinary:    General: Normal vulva.     Rectum: Normal.  Musculoskeletal:        General: Normal range of motion.     Cervical back: Normal range of motion and neck supple.     Right lower leg: No edema.     Left lower leg: No edema.  Lymphadenopathy:     Cervical: No cervical adenopathy.  Skin:    General: Skin is warm and dry.  Neurological:     Mental Status: She is alert and oriented to person, place, and time.     Deep Tendon Reflexes: Reflexes are normal and symmetric.  Psychiatric:        Mood and Affect: Mood normal.        Behavior: Behavior normal.    No results found for any visits on 07/31/21.    Assessment & Plan:    Routine Health Maintenance and Physical Exam  Immunization History  Administered Date(s) Administered   Influenza,inj,Quad PF,6+ Mos 05/26/2015, 12/11/2020   Influenza-Unspecified 12/06/2018   PFIZER(Purple Top)SARS-COV-2 Vaccination 06/09/2019, 07/04/2019, 03/12/2020   Tdap 12/13/2018   Zoster Recombinat (Shingrix) 12/11/2020, 07/31/2021   Health Maintenance  Topic Date Due   COLONOSCOPY (Pts 45-32yrs Insurance coverage will need to be confirmed)  Never done   COVID-19 Vaccine (4 - Booster for [employer] series) 08/16/2021 (Originally 05/07/2020)   Hepatitis C Screening  08/01/2022 (Originally [birth year]/1987)   MAMMOGRAM  09/06/2021   INFLUENZA VACCINE  10/08/2021   PAP SMEAR-Modifier  09/07/2022   TETANUS/TDAP  12/12/2028   HIV Screening  Completed   Zoster Vaccines- Shingrix  Completed   HPV VACCINES  Aged Out   Discussed health benefits of physical activity, and encouraged her to engage in regular exercise appropriate for her age and condition. Maintain heart healthy diet  and regular exercise  minimize alcohol intake and eliminate marijuana use. You will be contacted to schedule appt with GI Schedule appt for annual eye exam.  Problem List Items Addressed This Visit       Other   Iron deficiency anemia due to chronic blood loss   Relevant Medications   Iron Polysacch Cmplx-B12-FA 150-0.025-1 MG CAPS   Other Visit Diagnoses     Encounter for preventative adult health care exam with abnormal findings    -  Primary   Encounter for lipid screening for cardiovascular disease       Relevant Orders   Lipid panel   Colon cancer screening       Relevant Orders   Ambulatory referral to Gastroenterology   Family history of colonic polyps       Relevant Orders   Ambulatory referral to Gastroenterology   Need for shingles vaccine       Relevant Orders   Varicella-zoster vaccine IM (Shingrix) (Completed)      Return in about 1 year (around 08/01/2022) for CPE (fasting).     Wilfred Lacy, NP

## 2021-07-31 NOTE — Patient Instructions (Signed)
Go to lab Maintain heart healthy diet and regular exercise  minimize alcohol intake and eliminate marijuana use. You will be contacted to schedule appt with GI Schedule appt for annual eye exam.  Preventive Care 51-52 Years Old, Female Preventive care refers to lifestyle choices and visits with your health care provider that can promote health and wellness. Preventive care visits are also called wellness exams. What can I expect for my preventive care visit? Counseling Your health care provider may ask you questions about your: Medical history, including: Past medical problems. Family medical history. Pregnancy history. Current health, including: Menstrual cycle. Method of birth control. Emotional well-being. Home life and relationship well-being. Sexual activity and sexual health. Lifestyle, including: Alcohol, nicotine or tobacco, and drug use. Access to firearms. Diet, exercise, and sleep habits. Work and work Statistician. Sunscreen use. Safety issues such as seatbelt and bike helmet use. Physical exam Your health care provider will check your: Height and weight. These may be used to calculate your BMI (body mass index). BMI is a measurement that tells if you are at a healthy weight. Waist circumference. This measures the distance around your waistline. This measurement also tells if you are at a healthy weight and may help predict your risk of certain diseases, such as type 2 diabetes and high blood pressure. Heart rate and blood pressure. Body temperature. Skin for abnormal spots. What immunizations do I need?  Vaccines are usually given at various ages, according to a schedule. Your health care provider will recommend vaccines for you based on your age, medical history, and lifestyle or other factors, such as travel or where you work. What tests do I need? Screening Your health care provider may recommend screening tests for certain conditions. This may include: Lipid  and cholesterol levels. Diabetes screening. This is done by checking your blood sugar (glucose) after you have not eaten for a while (fasting). Pelvic exam and Pap test. Hepatitis B test. Hepatitis C test. HIV (human immunodeficiency virus) test. STI (sexually transmitted infection) testing, if you are at risk. Lung cancer screening. Colorectal cancer screening. Mammogram. Talk with your health care provider about when you should start having regular mammograms. This may depend on whether you have a family history of breast cancer. BRCA-related cancer screening. This may be done if you have a family history of breast, ovarian, tubal, or peritoneal cancers. Bone density scan. This is done to screen for osteoporosis. Talk with your health care provider about your test results, treatment options, and if necessary, the need for more tests. Follow these instructions at home: Eating and drinking  Eat a diet that includes fresh fruits and vegetables, whole grains, lean protein, and low-fat dairy products. Take vitamin and mineral supplements as recommended by your health care provider. Do not drink alcohol if: Your health care provider tells you not to drink. You are pregnant, may be pregnant, or are planning to become pregnant. If you drink alcohol: Limit how much you have to 0-1 drink a day. Know how much alcohol is in your drink. In the [place] one drink equals one 12 oz bottle of beer (355 mL), one 5 oz glass of wine (148 mL), or one 1 oz glass of hard liquor (44 mL). Lifestyle Brush your teeth every morning and night with fluoride toothpaste. Floss one time each day. Exercise for at least 30 minutes 5 or more days each week. Do not use any products that contain nicotine or tobacco. These products include cigarettes, chewing tobacco, and vaping devices,  such as e-cigarettes. If you need help quitting, ask your health care provider. Do not use drugs. If you are sexually active, practice safe  sex. Use a condom or other form of protection to prevent STIs. If you do not wish to become pregnant, use a form of birth control. If you plan to become pregnant, see your health care provider for a prepregnancy visit. Take aspirin only as told by your health care provider. Make sure that you understand how much to take and what form to take. Work with your health care provider to find out whether it is safe and beneficial for you to take aspirin daily. Find healthy ways to manage stress, such as: Meditation, yoga, or listening to music. Journaling. Talking to a trusted person. Spending time with friends and family. Minimize exposure to UV radiation to reduce your risk of skin cancer. Safety Always wear your seat belt while driving or riding in a vehicle. Do not drive: If you have been drinking alcohol. Do not ride with someone who has been drinking. When you are tired or distracted. While texting. If you have been using any mind-altering substances or drugs. Wear a helmet and other protective equipment during sports activities. If you have firearms in your house, make sure you follow all gun safety procedures. Seek help if you have been physically or sexually abused. What's next? Visit your health care provider once a year for an annual wellness visit. Ask your health care provider how often you should have your eyes and teeth checked. Stay up to date on all vaccines. This information is not intended to replace advice given to you by your health care provider. Make sure you discuss any questions you have with your health care provider. Document Revised: 08/22/2020 Document Reviewed: 08/22/2020 Elsevier Patient Education  [place].

## 2021-08-02 ENCOUNTER — Ambulatory Visit (INDEPENDENT_AMBULATORY_CARE_PROVIDER_SITE_OTHER): Payer: 59

## 2021-08-02 VITALS — BP 137/86 | HR 67 | Temp 98.4°F | Resp 18 | Ht 66.0 in | Wt 183.6 lb

## 2021-08-02 DIAGNOSIS — D500 Iron deficiency anemia secondary to blood loss (chronic): Secondary | ICD-10-CM | POA: Diagnosis not present

## 2021-08-02 MED ORDER — SODIUM CHLORIDE 0.9 % IV SOLN
200.0000 mg | Freq: Once | INTRAVENOUS | Status: AC
  Administered 2021-08-02: 200 mg via INTRAVENOUS
  Filled 2021-08-02: qty 10

## 2021-08-02 NOTE — Progress Notes (Signed)
Diagnosis: Iron Deficiency Anemia  Provider:  Chilton Greathouse, MD  Procedure: Infusion  IV Type: Peripheral, IV Location: R Forearm  Venofer (Iron Sucrose), Dose: 200 mg  Infusion Start Time: 0939  Infusion Stop Time: 0955  Post Infusion IV Care: Peripheral IV Discontinued  Discharge: Condition: Good, Destination: Home . AVS provided to patient.   Performed by:  Adriana Mccallum, RN

## 2021-08-09 ENCOUNTER — Ambulatory Visit (INDEPENDENT_AMBULATORY_CARE_PROVIDER_SITE_OTHER): Payer: 59

## 2021-08-09 VITALS — BP 129/91 | HR 78 | Temp 98.8°F | Resp 18 | Ht 66.0 in | Wt 183.8 lb

## 2021-08-09 DIAGNOSIS — D500 Iron deficiency anemia secondary to blood loss (chronic): Secondary | ICD-10-CM

## 2021-08-09 MED ORDER — SODIUM CHLORIDE 0.9 % IV SOLN
200.0000 mg | Freq: Once | INTRAVENOUS | Status: AC
  Administered 2021-08-09: 200 mg via INTRAVENOUS
  Filled 2021-08-09: qty 10

## 2021-08-09 NOTE — Progress Notes (Signed)
Diagnosis: Iron Deficiency Anemia  Provider:  Chilton Greathouse, MD  Procedure: Infusion  IV Type: Peripheral, IV Location: L Forearm  Venofer (Iron Sucrose), Dose: 200 mg  Infusion Start Time: 0951  Infusion Stop Time: 1013  Post Infusion IV Care: Peripheral IV Discontinued  Discharge: Condition: Good, Destination: Home . AVS provided to patient.   Performed by:  Garnette Czech, RN

## 2021-08-16 ENCOUNTER — Ambulatory Visit (INDEPENDENT_AMBULATORY_CARE_PROVIDER_SITE_OTHER): Payer: 59

## 2021-08-16 VITALS — BP 139/81 | HR 69 | Temp 98.4°F | Resp 18 | Ht 66.0 in | Wt 184.2 lb

## 2021-08-16 DIAGNOSIS — D500 Iron deficiency anemia secondary to blood loss (chronic): Secondary | ICD-10-CM

## 2021-08-16 MED ORDER — SODIUM CHLORIDE 0.9 % IV SOLN
200.0000 mg | Freq: Once | INTRAVENOUS | Status: AC
  Administered 2021-08-16: 200 mg via INTRAVENOUS
  Filled 2021-08-16: qty 10

## 2021-08-16 NOTE — Progress Notes (Signed)
Diagnosis: Iron Deficiency Anemia  Provider:  Chilton Greathouse, MD  Procedure: Infusion  IV Type: Peripheral, IV Location: L Antecubital  Venofer (Iron Sucrose), Dose: 200 mg  Infusion Start Time: 0939  Infusion Stop Time: 0957  Post Infusion IV Care: Peripheral IV Discontinued  Discharge: Condition: Good, Destination: Home . AVS provided to patient.   Performed by:  Adriana Mccallum, RN

## 2021-08-21 ENCOUNTER — Telehealth: Payer: Self-pay | Admitting: *Deleted

## 2021-08-21 NOTE — Telephone Encounter (Signed)
TCT patient regarding her appt for tomorrow, 08/22/21. Spoke with her and advised that since her last iron infusion was on 08/16/21 we needed to see her  back here in 4-6 weeks. Advised that tomorrow is too soon  get the appropriate results.  Pt voiced understanding. Scheduling message sent to schedule pt for labs and MD visit in 4-6 weeks.  Dr. Leonides Schanz made aware.

## 2021-08-22 ENCOUNTER — Inpatient Hospital Stay: Payer: 59

## 2021-08-22 ENCOUNTER — Inpatient Hospital Stay: Payer: 59 | Admitting: Hematology and Oncology

## 2021-08-30 ENCOUNTER — Telehealth: Payer: Self-pay | Admitting: Hematology and Oncology

## 2021-09-26 ENCOUNTER — Other Ambulatory Visit: Payer: Self-pay | Admitting: Hematology and Oncology

## 2021-09-26 ENCOUNTER — Inpatient Hospital Stay: Payer: 59 | Attending: Hematology and Oncology | Admitting: Hematology and Oncology

## 2021-09-26 ENCOUNTER — Inpatient Hospital Stay: Payer: 59

## 2021-09-26 ENCOUNTER — Other Ambulatory Visit: Payer: Self-pay

## 2021-09-26 VITALS — BP 137/71 | HR 79 | Temp 98.2°F | Resp 18 | Ht 66.0 in | Wt 183.0 lb

## 2021-09-26 DIAGNOSIS — N920 Excessive and frequent menstruation with regular cycle: Secondary | ICD-10-CM | POA: Insufficient documentation

## 2021-09-26 DIAGNOSIS — D500 Iron deficiency anemia secondary to blood loss (chronic): Secondary | ICD-10-CM | POA: Insufficient documentation

## 2021-09-26 DIAGNOSIS — Z87891 Personal history of nicotine dependence: Secondary | ICD-10-CM | POA: Diagnosis not present

## 2021-09-26 DIAGNOSIS — Z800 Family history of malignant neoplasm of digestive organs: Secondary | ICD-10-CM | POA: Diagnosis not present

## 2021-09-26 LAB — CBC WITH DIFFERENTIAL (CANCER CENTER ONLY)
Abs Immature Granulocytes: 0.02 10*3/uL (ref 0.00–0.07)
Basophils Absolute: 0 10*3/uL (ref 0.0–0.1)
Basophils Relative: 0 %
Eosinophils Absolute: 0.1 10*3/uL (ref 0.0–0.5)
Eosinophils Relative: 2 %
HCT: 39.2 % (ref 36.0–46.0)
Hemoglobin: 12.9 g/dL (ref 12.0–15.0)
Immature Granulocytes: 0 %
Lymphocytes Relative: 39 %
Lymphs Abs: 2 10*3/uL (ref 0.7–4.0)
MCH: 28.1 pg (ref 26.0–34.0)
MCHC: 32.9 g/dL (ref 30.0–36.0)
MCV: 85.4 fL (ref 80.0–100.0)
Monocytes Absolute: 0.5 10*3/uL (ref 0.1–1.0)
Monocytes Relative: 10 %
Neutro Abs: 2.5 10*3/uL (ref 1.7–7.7)
Neutrophils Relative %: 49 %
Platelet Count: 317 10*3/uL (ref 150–400)
RBC: 4.59 MIL/uL (ref 3.87–5.11)
RDW: 16.8 % — ABNORMAL HIGH (ref 11.5–15.5)
WBC Count: 5.1 10*3/uL (ref 4.0–10.5)
nRBC: 0 % (ref 0.0–0.2)

## 2021-09-26 LAB — IRON AND IRON BINDING CAPACITY (CC-WL,HP ONLY)
Iron: 55 ug/dL (ref 28–170)
Saturation Ratios: 14 % (ref 10.4–31.8)
TIBC: 403 ug/dL (ref 250–450)
UIBC: 348 ug/dL (ref 148–442)

## 2021-09-26 LAB — RETIC PANEL
Immature Retic Fract: 8.6 % (ref 2.3–15.9)
RBC.: 4.63 MIL/uL (ref 3.87–5.11)
Retic Count, Absolute: 70.4 10*3/uL (ref 19.0–186.0)
Retic Ct Pct: 1.5 % (ref 0.4–3.1)
Reticulocyte Hemoglobin: 32.9 pg (ref >27.9)

## 2021-09-26 LAB — CMP (CANCER CENTER ONLY)
ALT: 14 U/L (ref 0–44)
AST: 15 U/L (ref 15–41)
Albumin: 4.3 g/dL (ref 3.5–5.0)
Alkaline Phosphatase: 77 U/L (ref 38–126)
Anion gap: 7 (ref 5–15)
BUN: 19 mg/dL (ref 6–20)
CO2: 22 mmol/L (ref 22–32)
Calcium: 9.5 mg/dL (ref 8.9–10.3)
Chloride: 109 mmol/L (ref 98–111)
Creatinine: 0.79 mg/dL (ref 0.44–1.00)
GFR, Estimated: >60 mL/min (ref >60)
Glucose, Bld: 102 mg/dL — ABNORMAL HIGH (ref 70–99)
Potassium: 4 mmol/L (ref 3.5–5.1)
Sodium: 138 mmol/L (ref 135–145)
Total Bilirubin: 0.3 mg/dL (ref 0.3–1.2)
Total Protein: 7.5 g/dL (ref 6.5–8.1)

## 2021-09-26 LAB — FERRITIN: Ferritin: 91 ng/mL (ref 11–307)

## 2021-09-26 NOTE — Progress Notes (Signed)
[facility] [phone]   [phone]  PROGRESS NOTE  Patient Care Team: Anne Ng, NP as PCP - General (Internal Medicine)  Hematological/Oncological History # Iron Deficiency Anemia in the Setting of GYN Bleeding 09/29/2008: WBC 7.6, Hgb 13.7, MCV 89.5, Plt 321 03/26/2015: WBC 7.1, Hgb 9.6, MCV 71.6, Plt 364 06/11/2021: WBC 6.5, Hgb 10.4, MCV 72.1, Plt 369. Iron 55, ferritin 4, iron sat 11%, TIBC 499.  06/28/2021: establish care with Dr. Leonides Schanz   5/12-08/16/2021: IV venofer 200 mg x 5 doses  Interval History:  Penny Schultz 51 y.o. female with medical history significant for iron deficiency anemia who presents for a follow up visit. The patient's last visit was on 06/28/2021 at which time she established care. In the interim since the last visit she received 5 doses of IV venofer from 5/12-08/16/2021.   On exam today Penny Schultz reports he tolerated the 5 doses of IV iron sucrose quite well.  She not have any side effects as result of this treatment.  She reports that she has not had any dizzy spells since she received IV iron therapy.  She reports that she is also not been terribly tired before she even started.  She notes her energy is about the same and ranks is about a 10 out of 10.  She notes that she has been working outside in the heat and she did have an episode where she stood up too fast and had to sit right back down.  She notes that her weight has been steady and her appetite has been quite good.  She is also working with different diets in order to try to lose weight.  She is at her menstrual cycles are also about the same level heaviness.  She has not had a colonoscopy scheduled but notes that that is something she is interested in.  Her primary care provider discussed getting her scheduled [place] for colonoscopy.  She otherwise denies any fevers, chills, sweats, nausea, vomiting or diarrhea.  A full 10 point ROS is listed below  MEDICAL  HISTORY:  Past Medical History:  Diagnosis Date   Blood transfusion without reported diagnosis     SURGICAL HISTORY: Past Surgical History:  Procedure Laterality Date   LEEP  2007   open heart surgery 51 years old     uterine ablation      SOCIAL HISTORY: Social History   Socioeconomic History   Marital status: Single    Spouse name: Not on file   Number of children: 0   Years of education: Not on file   Highest education level: Not on file  Occupational History   Not on file  Tobacco Use   Smoking status: Former    Packs/day: 1.00    Types: Cigarettes    Quit date: 03/13/2015    Years since quitting: 6.5   Smokeless tobacco: Never  Vaping Use   Vaping Use: Never used  Substance and Sexual Activity   Alcohol use: Yes    Alcohol/week: 6.0 standard drinks of alcohol    Types: 2 Cans of beer, 4 Shots of liquor per week    Comment: daily   Drug use: Yes    Frequency: 7.0 times per week    Types: Marijuana   Sexual activity: Not Currently    Birth control/protection: None  Other Topics Concern   Not on file  Social History Narrative   Not on file   Social Determinants of Health  Financial Resource Strain: Not on file  Food Insecurity: Not on file  Transportation Needs: Not on file  Physical Activity: Not on file  Stress: Not on file  Social Connections: Not on file  Intimate Partner Violence: Not on file    FAMILY HISTORY: Family History  Problem Relation Age of Onset   Diabetes Mother    Hypertension Mother    Colon polyps Mother    Diabetes Father    Heart disease Father     ALLERGIES:  has No Known Allergies.  MEDICATIONS:  Current Outpatient Medications  Medication Sig Dispense Refill   ibuprofen (ADVIL) 800 MG tablet Take 1 tablet (800 mg total) by mouth every 8 (eight) hours as needed. 30 tablet 0   Iron Polysacch Cmplx-B12-FA 150-0.025-1 MG CAPS Take 1 tablet by mouth daily at 12 noon. With food 90 capsule 3   Vitamin D, Ergocalciferol,  (DRISDOL) 1.25 MG (50000 UNIT) CAPS capsule TAKE 1 CAPSULE BY MOUTH EVERY 7 DAYS 4 capsule 2   No current facility-administered medications for this visit.    REVIEW OF SYSTEMS:   Constitutional: ( - ) fevers, ( - )  chills , ( - ) night sweats Eyes: ( - ) blurriness of vision, ( - ) double vision, ( - ) watery eyes Ears, nose, mouth, throat, and face: ( - ) mucositis, ( - ) sore throat Respiratory: ( - ) cough, ( - ) dyspnea, ( - ) wheezes Cardiovascular: ( - ) palpitation, ( - ) chest discomfort, ( - ) lower extremity swelling Gastrointestinal:  ( - ) nausea, ( - ) heartburn, ( - ) change in bowel habits Skin: ( - ) abnormal skin rashes Lymphatics: ( - ) new lymphadenopathy, ( - ) easy bruising Neurological: ( - ) numbness, ( - ) tingling, ( - ) new weaknesses Behavioral/Psych: ( - ) mood change, ( - ) new changes  All other systems were reviewed with the patient and are negative.  PHYSICAL EXAMINATION:  Vitals:   09/26/21 0817  BP: 137/71  Pulse: 79  Resp: 18  Temp: 98.2 F (36.8 C)  SpO2: 100%   Filed Weights   09/26/21 0817  Weight: 183 lb (83 kg)    GENERAL: well appearing middle-aged African-American female, alert, no distress and comfortable SKIN: skin color, texture, turgor are normal, no rashes or significant lesions EYES: conjunctiva are pink and non-injected, sclera clear LUNGS: clear to auscultation and percussion with normal breathing effort HEART: regular rate & rhythm and no murmurs and no lower extremity edema Musculoskeletal: no cyanosis of digits and no clubbing  PSYCH: alert & oriented x 3, fluent speech NEURO: no focal motor/sensory deficits  LABORATORY DATA:  I have reviewed the data as listed    Latest Ref Rng & Units 09/26/2021    7:54 AM 06/11/2021    2:09 PM 12/11/2020    9:33 AM  CBC  WBC 4.0 - 10.5 K/uL 5.1  6.5  5.8   Hemoglobin 12.0 - 15.0 g/dL 72.5  36.6  8.6   Hematocrit 36.0 - 46.0 % 39.2  32.8  28.9   Platelets 150 - 400 K/uL 317   369.0  404.0        Latest Ref Rng & Units 09/26/2021    7:54 AM 06/11/2021    2:09 PM 05/16/2020   10:13 AM  CMP  Glucose 70 - 99 mg/dL 440  92  86   BUN 6 - 20 mg/dL 19  15  14  Creatinine 0.44 - 1.00 mg/dL 1.61  0.96  0.45   Sodium 135 - 145 mmol/L 138  135  137   Potassium 3.5 - 5.1 mmol/L 4.0  3.9  4.1   Chloride 98 - 111 mmol/L 109  101  104   CO2 22 - 32 mmol/L 22  27  25    Calcium 8.9 - 10.3 mg/dL 9.5  9.2  9.2   Total Protein 6.5 - 8.1 g/dL 7.5     Total Bilirubin 0.3 - 1.2 mg/dL 0.3     Alkaline Phos 38 - 126 U/L 77     AST 15 - 41 U/L 15   13   ALT 0 - 44 U/L 14   9     RADIOGRAPHIC STUDIES: No results found.  ASSESSMENT & PLAN 51 y.o. female with medical history significant for iron deficiency anemia who presents for a follow up visit.   # Iron Deficiency Anemia 2/2 to GYN Bleeding -- Findings are consistent with iron deficiency anemia secondary to patient's menorrhagia --Encouraged her to schedule colonoscopy with the GI provider as recommended by her primary care provider. -- Labs today show WBC 5.1, Hgb 12.9, Plt 317, MCV 85.4 --patient is s/p IV venofer x 5 doses on 5/12-08/16/2021 --Plan for return to clinic in 4 to 6 weeks time after last dose of IV iron  #Healthcare Maintenance --will refer to [facility] for screening colonoscopy  Orders Placed This Encounter  Procedures   Ambulatory referral to Gastroenterology    Referral Priority:   Routine    Referral Type:   Consultation    Referral Reason:   Specialty Services Required    Number of Visits Requested:   1    All questions were answered. The patient knows to call the clinic with any problems, questions or concerns.  A total of more than 30 minutes were spent on this encounter with face-to-face time and non-face-to-face time, including preparing to see the patient, ordering tests and/or medications, counseling the patient and coordination of care as outlined above.   10/16/2021,  MD Department of Hematology/Oncology [facility] Phone: [phone] Pager: [phone] Email: [phone]  09/26/2021 9:27 AM

## 2021-10-03 ENCOUNTER — Telehealth: Payer: Self-pay | Admitting: Hematology and Oncology

## 2021-10-03 NOTE — Telephone Encounter (Signed)
[facility] mailed for appt in 6 months

## 2022-04-01 ENCOUNTER — Other Ambulatory Visit: Payer: Self-pay | Admitting: Hematology and Oncology

## 2022-04-01 ENCOUNTER — Inpatient Hospital Stay: Payer: 59

## 2022-04-01 ENCOUNTER — Inpatient Hospital Stay: Payer: 59 | Admitting: Hematology and Oncology

## 2022-04-01 DIAGNOSIS — D500 Iron deficiency anemia secondary to blood loss (chronic): Secondary | ICD-10-CM

## 2022-04-11 ENCOUNTER — Inpatient Hospital Stay: Payer: 59 | Admitting: Physician Assistant

## 2022-04-11 ENCOUNTER — Inpatient Hospital Stay: Payer: 59 | Attending: Physician Assistant

## 2022-04-11 ENCOUNTER — Other Ambulatory Visit: Payer: Self-pay

## 2022-04-11 VITALS — BP 157/82 | HR 85 | Temp 98.3°F | Resp 17 | Wt 188.7 lb

## 2022-04-11 DIAGNOSIS — N920 Excessive and frequent menstruation with regular cycle: Secondary | ICD-10-CM | POA: Diagnosis present

## 2022-04-11 DIAGNOSIS — D500 Iron deficiency anemia secondary to blood loss (chronic): Secondary | ICD-10-CM | POA: Diagnosis not present

## 2022-04-11 DIAGNOSIS — Z87891 Personal history of nicotine dependence: Secondary | ICD-10-CM | POA: Diagnosis not present

## 2022-04-11 LAB — IRON AND IRON BINDING CAPACITY (CC-WL,HP ONLY)
Iron: 54 ug/dL (ref 28–170)
Saturation Ratios: 13 % (ref 10.4–31.8)
TIBC: 406 ug/dL (ref 250–450)
UIBC: 352 ug/dL (ref 148–442)

## 2022-04-11 LAB — CMP (CANCER CENTER ONLY)
ALT: 11 U/L (ref 0–44)
AST: 13 U/L — ABNORMAL LOW (ref 15–41)
Albumin: 4 g/dL (ref 3.5–5.0)
Alkaline Phosphatase: 82 U/L (ref 38–126)
Anion gap: 6 (ref 5–15)
BUN: 17 mg/dL (ref 6–20)
CO2: 26 mmol/L (ref 22–32)
Calcium: 9.2 mg/dL (ref 8.9–10.3)
Chloride: 107 mmol/L (ref 98–111)
Creatinine: 0.79 mg/dL (ref 0.44–1.00)
GFR, Estimated: >60 mL/min (ref >60)
Glucose, Bld: 90 mg/dL (ref 70–99)
Potassium: 4.1 mmol/L (ref 3.5–5.1)
Sodium: 139 mmol/L (ref 135–145)
Total Bilirubin: 0.3 mg/dL (ref 0.3–1.2)
Total Protein: 6.9 g/dL (ref 6.5–8.1)

## 2022-04-11 LAB — CBC WITH DIFFERENTIAL (CANCER CENTER ONLY)
Abs Immature Granulocytes: 0.02 10*3/uL (ref 0.00–0.07)
Basophils Absolute: 0 10*3/uL (ref 0.0–0.1)
Basophils Relative: 0 %
Eosinophils Absolute: 0.1 10*3/uL (ref 0.0–0.5)
Eosinophils Relative: 2 %
HCT: 38.2 % (ref 36.0–46.0)
Hemoglobin: 12.8 g/dL (ref 12.0–15.0)
Immature Granulocytes: 0 %
Lymphocytes Relative: 38 %
Lymphs Abs: 2.2 10*3/uL (ref 0.7–4.0)
MCH: 29.4 pg (ref 26.0–34.0)
MCHC: 33.5 g/dL (ref 30.0–36.0)
MCV: 87.8 fL (ref 80.0–100.0)
Monocytes Absolute: 0.4 10*3/uL (ref 0.1–1.0)
Monocytes Relative: 8 %
Neutro Abs: 2.9 10*3/uL (ref 1.7–7.7)
Neutrophils Relative %: 52 %
Platelet Count: 279 10*3/uL (ref 150–400)
RBC: 4.35 MIL/uL (ref 3.87–5.11)
RDW: 12.6 % (ref 11.5–15.5)
WBC Count: 5.6 10*3/uL (ref 4.0–10.5)
nRBC: 0 % (ref 0.0–0.2)

## 2022-04-11 LAB — FERRITIN: Ferritin: 24 ng/mL (ref 11–307)

## 2022-04-11 LAB — RETIC PANEL
Immature Retic Fract: 17.3 % — ABNORMAL HIGH (ref 2.3–15.9)
RBC.: 4.34 MIL/uL (ref 3.87–5.11)
Retic Count, Absolute: 76.4 10*3/uL (ref 19.0–186.0)
Retic Ct Pct: 1.8 % (ref 0.4–3.1)
Reticulocyte Hemoglobin: 33 pg (ref >27.9)

## 2022-04-11 NOTE — Progress Notes (Signed)
[place] [phone]   [phone]  PROGRESS NOTE  Patient Care Team: Flossie Buffy, NP as PCP - General (Internal Medicine)  Hematological/Oncological History # Iron Deficiency Anemia in the Setting of GYN Bleeding 09/29/2008: WBC 7.6, Hgb 13.7, MCV 89.5, Plt 321 03/26/2015: WBC 7.1, Hgb 9.6, MCV 71.6, Plt 364 06/11/2021: WBC 6.5, Hgb 10.4, MCV 72.1, Plt 369. Iron 55, ferritin 4, iron sat 11%, TIBC 499.  06/28/2021: establish care with Dr. Lorenso Courier   5/12-08/16/2021: IV venofer 200 mg x 5 doses  Interval History:  Penny Schultz 52 y.o. female with medical history significant for iron deficiency anemia who presents for a follow up visit. The patient's last visit was on 09/26/2021 at which time she established care. In the interim since the last visit she has continued on p.o iron therapy.   On exam today Mrs. Lazaro reports her energy levels have improved especially since switching jobs.  She continues to complete all her ADLs on her own.  She has a good appetite and denies any dietary restrictions.  She denies any nausea, vomiting or abdominal pain.  Her bowel habits are unchanged without any signs of diarrhea or constipation.  Her menstrual cycles are irregular as she is going through menopause.  She her last cycle that finished yesterday.  She said the cycle lasted about 5 days with 1 to 2 days of heavy bleeding.  She said prior to that, she did not have a menstrual cycle for 2 months.  She is compliant with taking her iron pills as prescribed without any toxicities.  She denies fevers, chills, night sweats, shortness of breath, chest pain or cough.  She has no other complaints.  Rest of the 10 point ROS is below.  MEDICAL HISTORY:  Past Medical History:  Diagnosis Date   Blood transfusion without reported diagnosis     SURGICAL HISTORY: Past Surgical History:  Procedure Laterality Date   LEEP  2007   open heart surgery 52 years old     uterine  ablation      SOCIAL HISTORY: Social History   Socioeconomic History   Marital status: Single    Spouse name: Not on file   Number of children: 0   Years of education: Not on file   Highest education level: Not on file  Occupational History   Not on file  Tobacco Use   Smoking status: Former    Packs/day: 1.00    Types: Cigarettes    Quit date: 03/13/2015    Years since quitting: 7.0   Smokeless tobacco: Never  Vaping Use   Vaping Use: Never used  Substance and Sexual Activity   Alcohol use: Yes    Alcohol/week: 6.0 standard drinks of alcohol    Types: 2 Cans of beer, 4 Shots of liquor per week    Comment: daily   Drug use: Yes    Frequency: 7.0 times per week    Types: Marijuana   Sexual activity: Not Currently    Birth control/protection: None  Other Topics Concern   Not on file  Social History Narrative   Not on file   Social Determinants of Health   Financial Resource Strain: Not on file  Food Insecurity: Not on file  Transportation Needs: Not on file  Physical Activity: Not on file  Stress: Not on file  Social Connections: Not on file  Intimate Partner Violence: Not on file    FAMILY HISTORY: Family History  Problem Relation Age  of Onset   Diabetes Mother    Hypertension Mother    Colon polyps Mother    Diabetes Father    Heart disease Father     ALLERGIES:  has No Known Allergies.  MEDICATIONS:  Current Outpatient Medications  Medication Sig Dispense Refill   ibuprofen (ADVIL) 800 MG tablet Take 1 tablet (800 mg total) by mouth every 8 (eight) hours as needed. 30 tablet 0   Iron Polysacch Cmplx-B12-FA 150-0.025-1 MG CAPS Take 1 tablet by mouth daily at 12 noon. With food 90 capsule 3   Vitamin D, Ergocalciferol, (DRISDOL) 1.25 MG (50000 UNIT) CAPS capsule TAKE 1 CAPSULE BY MOUTH EVERY 7 DAYS (Patient not taking: Reported on 04/11/2022) 4 capsule 2   No current facility-administered medications for this visit.    REVIEW OF SYSTEMS:    Constitutional: ( - ) fevers, ( - )  chills , ( - ) night sweats Eyes: ( - ) blurriness of vision, ( - ) double vision, ( - ) watery eyes Ears, nose, mouth, throat, and face: ( - ) mucositis, ( - ) sore throat Respiratory: ( - ) cough, ( - ) dyspnea, ( - ) wheezes Cardiovascular: ( - ) palpitation, ( - ) chest discomfort, ( - ) lower extremity swelling Gastrointestinal:  ( - ) nausea, ( - ) heartburn, ( - ) change in bowel habits Skin: ( - ) abnormal skin rashes Lymphatics: ( - ) new lymphadenopathy, ( - ) easy bruising Neurological: ( - ) numbness, ( - ) tingling, ( - ) new weaknesses Behavioral/Psych: ( - ) mood change, ( - ) new changes  All other systems were reviewed with the patient and are negative.  PHYSICAL EXAMINATION:  Vitals:   04/11/22 0819  BP: (!) 157/82  Pulse: 85  Resp: 17  Temp: 98.3 F (36.8 C)  SpO2: 100%   Filed Weights   04/11/22 0819  Weight: 188 lb 11.2 oz (85.6 kg)    GENERAL: well appearing middle-aged African-American female, alert, no distress and comfortable SKIN: skin color, texture, turgor are normal, no rashes or significant lesions EYES: conjunctiva are pink and non-injected, sclera clear LUNGS: clear to auscultation and percussion with normal breathing effort HEART: regular rate & rhythm and no murmurs and no lower extremity edema Musculoskeletal: no cyanosis of digits and no clubbing  PSYCH: alert & oriented x 3, fluent speech NEURO: no focal motor/sensory deficits  LABORATORY DATA:  I have reviewed the data as listed    Latest Ref Rng & Units 04/11/2022    8:04 AM 09/26/2021    7:54 AM 06/11/2021    2:09 PM  CBC  WBC 4.0 - 10.5 K/uL 5.6  5.1  6.5   Hemoglobin 12.0 - 15.0 g/dL 12.8  12.9  10.4   Hematocrit 36.0 - 46.0 % 38.2  39.2  32.8   Platelets 150 - 400 K/uL 279  317  369.0        Latest Ref Rng & Units 09/26/2021    7:54 AM 06/11/2021    2:09 PM 05/16/2020   10:13 AM  CMP  Glucose 70 - 99 mg/dL 102  92  86   BUN 6 - 20 mg/dL  19  15  14    Creatinine 0.44 - 1.00 mg/dL 0.79  0.82  0.89   Sodium 135 - 145 mmol/L 138  135  137   Potassium 3.5 - 5.1 mmol/L 4.0  3.9  4.1   Chloride 98 - 111 mmol/L 109  101  104   CO2 22 - 32 mmol/L 22  27  25    Calcium 8.9 - 10.3 mg/dL 9.5  9.2  9.2   Total Protein 6.5 - 8.1 g/dL 7.5     Total Bilirubin 0.3 - 1.2 mg/dL 0.3     Alkaline Phos 38 - 126 U/L 77     AST 15 - 41 U/L 15   13   ALT 0 - 44 U/L 14   9     RADIOGRAPHIC STUDIES: No results found.  ASSESSMENT & PLAN KERYN NESSLER is a 52 y.o. female with medical history significant for iron deficiency anemia who presents for a follow up visit.   # Iron Deficiency Anemia 2/2 to GYN Bleeding --Findings are consistent with iron deficiency anemia secondary to patient's menorrhagia --patient is s/p IV venofer x 5 doses on 5/12-08/16/2021 --Labs from today show no evidence of anemia with a hemoglobin of 12.8, MCV 87.8.  Iron panel is pending. --No indication for IV iron at this time.  Continue on oral iron supplementation as prescribed. --Plan for return to clinic in 6 months with labs at follow-up visit.  #Healthcare Maintenance -- Sent referral to [facility] for screening colonoscopy, awaiting to get scheduled.  Encourage patient to follow-up with the gastroenterology team.  No orders of the defined types were placed in this encounter.   All questions were answered. The patient knows to call the clinic with any problems, questions or concerns.  I have spent a total of 25 minutes minutes of face-to-face and non-face-to-face time, preparing to see the patient,  performing a medically appropriate examination, counseling and educating the patient, documenting clinical information in the electronic health record, and care coordination.   Dede Query PA-C Dept of Hematology and [place] at [facility] Phone: [phone]   04/11/2022 8:41 AM

## 2022-04-18 ENCOUNTER — Telehealth: Payer: Self-pay

## 2022-04-18 NOTE — Telephone Encounter (Signed)
Penny Schultz: can you let patient that that her iron levels did drop from last visit. Still no indication for IV iron but we will want to have a lab check in 3 months   IT [place]: can you schedule a lab only visit in 3 months  Pt advised and confirmed lab only appt for 5/3 at 8:30.

## 2022-07-11 ENCOUNTER — Other Ambulatory Visit: Payer: Self-pay | Admitting: Physician Assistant

## 2022-07-11 ENCOUNTER — Inpatient Hospital Stay: Payer: 59 | Attending: Physician Assistant

## 2022-07-11 DIAGNOSIS — D500 Iron deficiency anemia secondary to blood loss (chronic): Secondary | ICD-10-CM

## 2022-08-15 ENCOUNTER — Encounter (HOSPITAL_COMMUNITY): Payer: Self-pay

## 2022-08-15 ENCOUNTER — Ambulatory Visit (HOSPITAL_COMMUNITY)
Admission: EM | Admit: 2022-08-15 | Discharge: 2022-08-15 | Disposition: A | Discharge status: Home / Self Care | Payer: 59 | Attending: Urgent Care | Admitting: Urgent Care

## 2022-08-15 DIAGNOSIS — H6121 Impacted cerumen, right ear: Secondary | ICD-10-CM | POA: Diagnosis not present

## 2022-08-15 MED ORDER — CARBAMIDE PEROXIDE 6.5 % OT SOLN
OTIC | Status: AC
  Filled 2022-08-15: qty 15

## 2022-08-15 MED ORDER — CARBAMIDE PEROXIDE 6.5 % OT SOLN
10.0000 [drp] | Freq: Once | OTIC | Status: AC
  Administered 2022-08-15: 10 [drp] via OTIC

## 2022-08-15 NOTE — Discharge Instructions (Addendum)
Your earwax was successfully moved in our office. Please purchase the [employer]Med clear canal kit, you can find this at most grocery stores or pharmacies. Use the product inside you will use this once the wax builds up, do not use this kit for prevention of wax buildup. Please use earmuffs rather than earplugs at work. Do not use Qtips inside the ear.

## 2022-08-15 NOTE — ED Triage Notes (Signed)
Patient having ear fullness in both ears but mainly the right ear ongoing for a year but getting worse. Patient uses ear plugs at work.  Tried otc drops with no relief.

## 2022-08-15 NOTE — ED Provider Notes (Signed)
[facility]    CSN: [id number] Arrival date & time: 08/15/22  4098      History   Chief Complaint Chief Complaint  Patient presents with   Ear Fullness    HPI Penny Schultz is a 52 y.o. female.   HPI  Past Medical History:  Diagnosis Date   Blood transfusion without reported diagnosis     Patient Active Problem List   Diagnosis Date Noted   Family history of colonic polyps 07/31/2021   Iron deficiency anemia due to chronic blood loss 06/28/2021   Depression, recurrent (HCC) 06/11/2021   Elevated BP without diagnosis of hypertension 06/11/2021   Vitamin D deficiency 12/11/2020   Hyperglycemia 12/11/2020   Lumbar spondylosis 07/06/2018   Microcytic anemia 03/26/2015    Past Surgical History:  Procedure Laterality Date   LEEP  2007   open heart surgery 52 years old     uterine ablation      OB History   No obstetric history on file.      Home Medications    Prior to Admission medications   Medication Sig Start Date End Date Taking? Authorizing Provider  Iron Polysacch Cmplx-B12-FA 150-0.025-1 MG CAPS Take 1 tablet by mouth daily at 12 noon. With food 07/31/21  Yes Nche, Bonna Gains, NP  ibuprofen (ADVIL) 800 MG tablet Take 1 tablet (800 mg total) by mouth every 8 (eight) hours as needed. 12/23/18   Doristine Bosworth, MD  Vitamin D, Ergocalciferol, (DRISDOL) 1.25 MG (50000 UNIT) CAPS capsule TAKE 1 CAPSULE BY MOUTH EVERY 7 DAYS Patient not taking: Reported on 04/11/2022 04/03/21   Nche, Bonna Gains, NP    Family History Family History  Problem Relation Age of Onset   Diabetes Mother    Hypertension Mother    Colon polyps Mother    Diabetes Father    Heart disease Father     Social History Social History   Tobacco Use   Smoking status: Former    Packs/day: 1    Types: Cigarettes    Quit date: 03/13/2015    Years since quitting: 7.4   Smokeless tobacco: Never  Vaping Use   Vaping Use: Never used  Substance Use Topics   Alcohol use:  Yes    Alcohol/week: 6.0 standard drinks of alcohol    Types: 2 Cans of beer, 4 Shots of liquor per week    Comment: daily   Drug use: Yes    Frequency: 7.0 times per week    Types: Marijuana     Allergies   Patient has no known allergies.   Review of Systems Review of Systems   Physical Exam Triage Vital Signs ED Triage Vitals  Enc Vitals Group     BP 08/15/22 1036 (!) 152/93     Pulse Rate 08/15/22 1036 74     Resp 08/15/22 1036 18     Temp 08/15/22 1048 98.6 F (37 C)     Temp Source 08/15/22 1048 Oral     SpO2 08/15/22 1036 96 %     Weight --      Height 08/15/22 1036 5\' 6"  (1.676 m)     Head Circumference --      Peak Flow --      Pain Score 08/15/22 1035 0     Pain Loc --      Pain Edu? --      Excl. in GC? --    No data found.  Updated Vital Signs BP Marland Kitchen)  152/93 (BP Location: Left Arm)   Pulse 74   Temp 98.6 F (37 C) (Oral)   Resp 18   Ht 5\' 6"  (1.676 m)   LMP  (LMP Unknown)   SpO2 96%   BMI 30.46 kg/m   Visual Acuity Right Eye Distance:   Left Eye Distance:   Bilateral Distance:    Right Eye Near:   Left Eye Near:    Bilateral Near:     Physical Exam   UC Treatments / Results  Labs (all labs ordered are listed, but only abnormal results are displayed) Labs Reviewed - No data to display  EKG   Radiology No results found.  Procedures Procedures (including critical care time)  Medications Ordered in UC Medications - No data to display  Initial Impression / Assessment and Plan / UC Course  I have reviewed the triage vital signs and the nursing notes.  Pertinent labs & imaging results that were available during my care of the patient were reviewed by me and considered in my medical decision making (see chart for details).     *** Final Clinical Impressions(s) / UC Diagnoses   Final diagnoses:  None   Discharge Instructions   None    ED Prescriptions   None    PDMP not reviewed this encounter.

## 2022-10-09 ENCOUNTER — Other Ambulatory Visit: Payer: Self-pay | Admitting: Physician Assistant

## 2022-10-09 DIAGNOSIS — D500 Iron deficiency anemia secondary to blood loss (chronic): Secondary | ICD-10-CM

## 2022-10-10 ENCOUNTER — Other Ambulatory Visit: Payer: Self-pay

## 2022-10-10 ENCOUNTER — Inpatient Hospital Stay: Payer: 59 | Admitting: Physician Assistant

## 2022-10-10 ENCOUNTER — Inpatient Hospital Stay: Payer: 59 | Attending: Physician Assistant

## 2022-10-10 DIAGNOSIS — N920 Excessive and frequent menstruation with regular cycle: Secondary | ICD-10-CM | POA: Diagnosis present

## 2022-10-10 DIAGNOSIS — D500 Iron deficiency anemia secondary to blood loss (chronic): Secondary | ICD-10-CM | POA: Insufficient documentation

## 2022-10-10 LAB — CBC WITH DIFFERENTIAL (CANCER CENTER ONLY)
Abs Immature Granulocytes: 0.01 10*3/uL (ref 0.00–0.07)
Basophils Absolute: 0 10*3/uL (ref 0.0–0.1)
Basophils Relative: 0 %
Eosinophils Absolute: 0.1 10*3/uL (ref 0.0–0.5)
Eosinophils Relative: 2 %
HCT: 39.1 % (ref 36.0–46.0)
Hemoglobin: 12.8 g/dL (ref 12.0–15.0)
Immature Granulocytes: 0 %
Lymphocytes Relative: 37 %
Lymphs Abs: 1.7 10*3/uL (ref 0.7–4.0)
MCH: 28.8 pg (ref 26.0–34.0)
MCHC: 32.7 g/dL (ref 30.0–36.0)
MCV: 87.9 fL (ref 80.0–100.0)
Monocytes Absolute: 0.5 10*3/uL (ref 0.1–1.0)
Monocytes Relative: 10 %
Neutro Abs: 2.5 10*3/uL (ref 1.7–7.7)
Neutrophils Relative %: 51 %
Platelet Count: 311 10*3/uL (ref 150–400)
RBC: 4.45 MIL/uL (ref 3.87–5.11)
RDW: 12.6 % (ref 11.5–15.5)
WBC Count: 4.8 10*3/uL (ref 4.0–10.5)
nRBC: 0 % (ref 0.0–0.2)

## 2022-10-10 LAB — IRON AND IRON BINDING CAPACITY (CC-WL,HP ONLY)
Iron: 47 ug/dL (ref 28–170)
Saturation Ratios: 10 % — ABNORMAL LOW (ref 10.4–31.8)
TIBC: 451 ug/dL — ABNORMAL HIGH (ref 250–450)
UIBC: 404 ug/dL (ref 148–442)

## 2022-10-10 LAB — FERRITIN: Ferritin: 18 ng/mL (ref 11–307)

## 2022-10-10 MED ORDER — IRON POLYSACCH CMPLX-B12-FA 150-0.025-1 MG PO CAPS
1.0000 | ORAL_CAPSULE | Freq: Every day | ORAL | 3 refills | Status: DC
Start: 2022-10-10 — End: 2023-10-16

## 2022-10-10 NOTE — Progress Notes (Signed)
[facility] [phone]   Fax:(336) [phone]  PROGRESS NOTE  Patient Care Team: Anne Ng, NP as PCP - General (Internal Medicine)  Hematological/Oncological History # Iron Deficiency Anemia in the Setting of GYN Bleeding 09/29/2008: WBC 7.6, Hgb 13.7, MCV 89.5, Plt 321 03/26/2015: WBC 7.1, Hgb 9.6, MCV 71.6, Plt 364 06/11/2021: WBC 6.5, Hgb 10.4, MCV 72.1, Plt 369. Iron 55, ferritin 4, iron sat 11%, TIBC 499.  06/28/2021: establish care with Dr. Leonides Schanz   5/12-08/16/2021: IV venofer 200 mg x 5 doses  Interval History:  Penny Schultz 52 y.o. female with medical history significant for iron deficiency anemia who presents for a follow up visit. The patient's last visit was on 04/11/2022. In the interim since the last visit she has continued on p.o iron therapy.   On exam today Penny Schultz reports she is eating healthier and exercising more which as results in 5 lb weight loss. She is trying to eat iron rich foods and is compliant with taking PO iron as prescribed. She continues to have irregular menstrual cycles with one every 2-3 months. Her last cycle was last month that lasted 6 days with 4 days of heavy bleeding. She has no other signs of bleeding. She denies nausea, vomiting or bowel habit changes. She denies fevers, chills, sweats, shortness of breath, chest pain, cough, headaches or dizziness. She has no other complaints. Rest of the ROS is below.   MEDICAL HISTORY:  Past Medical History:  Diagnosis Date   Blood transfusion without reported diagnosis     SURGICAL HISTORY: Past Surgical History:  Procedure Laterality Date   LEEP  2007   open heart surgery 52 years old     uterine ablation      SOCIAL HISTORY: Social History   Socioeconomic History   Marital status: Single    Spouse name: Not on file   Number of children: 0   Years of education: Not on file   Highest education level: Not on file  Occupational History   Not on file  Tobacco  Use   Smoking status: Former    Current packs/day: 0.00    Types: Cigarettes    Quit date: 03/13/2015    Years since quitting: 7.5   Smokeless tobacco: Never  Vaping Use   Vaping status: Never Used  Substance and Sexual Activity   Alcohol use: Yes    Alcohol/week: 6.0 standard drinks of alcohol    Types: 2 Cans of beer, 4 Shots of liquor per week    Comment: daily   Drug use: Yes    Frequency: 7.0 times per week    Types: Marijuana   Sexual activity: Not Currently    Birth control/protection: None  Other Topics Concern   Not on file  Social History Narrative   Not on file   Social Determinants of Health   Financial Resource Strain: Not on file  Food Insecurity: Not on file  Transportation Needs: Not on file  Physical Activity: Not on file  Stress: Not on file  Social Connections: Not on file  Intimate Partner Violence: Not on file    FAMILY HISTORY: Family History  Problem Relation Age of Onset   Diabetes Mother    Hypertension Mother    Colon polyps Mother    Diabetes Father    Heart disease Father     ALLERGIES:  has No Known Allergies.  MEDICATIONS:  Current Outpatient Medications  Medication Sig Dispense Refill   Iron Polysacch  Cmplx-B12-FA 150-0.025-1 MG CAPS Take 1 tablet by mouth daily at 12 noon. With food 90 capsule 3   No current facility-administered medications for this visit.    REVIEW OF SYSTEMS:   Constitutional: ( - ) fevers, ( - )  chills , ( - ) night sweats Eyes: ( - ) blurriness of vision, ( - ) double vision, ( - ) watery eyes Ears, nose, mouth, throat, and face: ( - ) mucositis, ( - ) sore throat Respiratory: ( - ) cough, ( - ) dyspnea, ( - ) wheezes Cardiovascular: ( - ) palpitation, ( - ) chest discomfort, ( - ) lower extremity swelling Gastrointestinal:  ( - ) nausea, ( - ) heartburn, ( - ) change in bowel habits Skin: ( - ) abnormal skin rashes Lymphatics: ( - ) new lymphadenopathy, ( - ) easy bruising Neurological: ( - )  numbness, ( - ) tingling, ( - ) new weaknesses Behavioral/Psych: ( - ) mood change, ( - ) new changes  All other systems were reviewed with the patient and are negative.  PHYSICAL EXAMINATION:  Vitals:   10/10/22 0829  BP: 130/80  Pulse: 78  Resp: 15  Temp: 98.2 F (36.8 C)  SpO2: 100%    Filed Weights   10/10/22 0829  Weight: 183 lb 11.2 oz (83.3 kg)     GENERAL: well appearing middle-aged African-American female, alert, no distress and comfortable SKIN: skin color, texture, turgor are normal, no rashes or significant lesions EYES: conjunctiva are pink and non-injected, sclera clear LUNGS: clear to auscultation and percussion with normal breathing effort HEART: regular rate & rhythm and no murmurs and no lower extremity edema Musculoskeletal: no cyanosis of digits and no clubbing  PSYCH: alert & oriented x 3, fluent speech NEURO: no focal motor/sensory deficits  LABORATORY DATA:  I have reviewed the data as listed    Latest Ref Rng & Units 10/10/2022    7:52 AM 04/11/2022    8:04 AM 09/26/2021    7:54 AM  CBC  WBC 4.0 - 10.5 K/uL 4.8  5.6  5.1   Hemoglobin 12.0 - 15.0 g/dL 29.5  28.4  13.2   Hematocrit 36.0 - 46.0 % 39.1  38.2  39.2   Platelets 150 - 400 K/uL 311  279  317        Latest Ref Rng & Units 04/11/2022    8:04 AM 09/26/2021    7:54 AM 06/11/2021    2:09 PM  CMP  Glucose 70 - 99 mg/dL 90  440  92   BUN 6 - 20 mg/dL 17  19  15    Creatinine 0.44 - 1.00 mg/dL 1.02  7.25  3.66   Sodium 135 - 145 mmol/L 139  138  135   Potassium 3.5 - 5.1 mmol/L 4.1  4.0  3.9   Chloride 98 - 111 mmol/L 107  109  101   CO2 22 - 32 mmol/L 26  22  27    Calcium 8.9 - 10.3 mg/dL 9.2  9.5  9.2   Total Protein 6.5 - 8.1 g/dL 6.9  7.5    Total Bilirubin 0.3 - 1.2 mg/dL 0.3  0.3    Alkaline Phos 38 - 126 U/L 82  77    AST 15 - 41 U/L 13  15    ALT 0 - 44 U/L 11  14      RADIOGRAPHIC STUDIES: No results found.  ASSESSMENT & PLAN Penny Schultz is a 52 y.o.  female with medical  history significant for iron deficiency anemia who presents for a follow up visit.   # Iron Deficiency Anemia 2/2 to GYN Bleeding --Findings are consistent with iron deficiency anemia secondary to patient's menorrhagia --patient is s/p IV venofer x 5 doses on 5/12-08/16/2021 --Labs from today show no evidence of anemia with a hemoglobin of 12.8, MCV 87.9.  Iron panel shows iron 47, TIBC 451, saturation 10%, ferritin 18. --Recommend IV venofer 200 mg x 2 doses to bolster levels. Continue on oral iron supplementation as prescribed. --Plan for return to clinic in 6 months with labs at follow-up visit.  No orders of the defined types were placed in this encounter.   All questions were answered. The patient knows to call the clinic with any problems, questions or concerns.  I have spent a total of 25 minutes minutes of face-to-face and non-face-to-face time, preparing to see the patient,  performing a medically appropriate examination, counseling and educating the patient, documenting clinical information in the electronic health record, and care coordination.   Georga Kaufmann PA-C Dept of Hematology and Oncology [facility] Phone: [phone]   10/10/2022 8:33 AM

## 2022-10-13 ENCOUNTER — Encounter: Payer: Self-pay | Admitting: Physician Assistant

## 2022-10-13 ENCOUNTER — Telehealth: Payer: Self-pay

## 2022-10-13 NOTE — Telephone Encounter (Signed)
-----   Message from Briant Cedar sent at 10/13/2022 11:57 AM EDT ----- Please notify patient that iron levels are below normal so we recommend IV venofer x 2 doses to bolster levels. Continue on iron pills. ----- Message ----- From: Interface, Lab In [place] Sent: 10/10/2022   8:10 AM EDT To: Briant Cedar, PA-C

## 2022-10-13 NOTE — Telephone Encounter (Signed)
LM for pt with lab results and recommendations. 

## 2022-10-16 ENCOUNTER — Telehealth: Payer: Self-pay | Admitting: Pharmacy Technician

## 2022-10-16 NOTE — Telephone Encounter (Signed)
Auth Submission: NO AUTH NEEDED Site of care: Site of care: [facility] Payer: [employer] Medication & CPT/J Code(s) submitted: Venofer (Iron Sucrose) J1756 Route of submission (phone, fax, portal):  Phone # Fax # Auth type: Buy/Bill PB Units/visits requested: 2 Reference number:  Approval from: 10/16/22 to 01/16/23

## 2022-10-17 ENCOUNTER — Telehealth: Payer: Self-pay | Admitting: Nurse Practitioner

## 2022-10-17 NOTE — Telephone Encounter (Signed)
Patient was last seen 05.2023- left voicemail to schedule annual physical

## 2022-11-14 ENCOUNTER — Ambulatory Visit (INDEPENDENT_AMBULATORY_CARE_PROVIDER_SITE_OTHER): Payer: 59

## 2022-11-14 VITALS — BP 144/83 | HR 61 | Temp 98.4°F | Resp 18 | Ht 66.0 in | Wt 189.2 lb

## 2022-11-14 DIAGNOSIS — D500 Iron deficiency anemia secondary to blood loss (chronic): Secondary | ICD-10-CM | POA: Diagnosis not present

## 2022-11-14 DIAGNOSIS — N920 Excessive and frequent menstruation with regular cycle: Secondary | ICD-10-CM

## 2022-11-14 MED ORDER — HEPARIN SOD (PORK) LOCK FLUSH 100 UNIT/ML IV SOLN: 250.0000 [IU] | Freq: Once | INTRAVENOUS | Status: DC | PRN

## 2022-11-14 MED ORDER — DIPHENHYDRAMINE HCL 50 MG/ML IJ SOLN: 50.0000 mg | Freq: Once | INTRAMUSCULAR | Status: DC | PRN

## 2022-11-14 MED ORDER — SODIUM CHLORIDE 0.9% FLUSH: 10.0000 mL | Freq: Once | INTRAVENOUS | Status: DC | PRN

## 2022-11-14 MED ORDER — ALBUTEROL SULFATE HFA 108 (90 BASE) MCG/ACT IN AERS: 2.0000 | INHALATION_SPRAY | Freq: Once | RESPIRATORY_TRACT | Status: DC | PRN

## 2022-11-14 MED ORDER — SODIUM CHLORIDE 0.9% FLUSH: 3.0000 mL | Freq: Once | INTRAVENOUS | Status: DC | PRN

## 2022-11-14 MED ORDER — ALTEPLASE 2 MG IJ SOLR: 2.0000 mg | Freq: Once | INTRAMUSCULAR | Status: DC | PRN

## 2022-11-14 MED ORDER — EPINEPHRINE 0.3 MG/0.3ML IJ SOAJ: 0.3000 mg | Freq: Once | INTRAMUSCULAR | Status: DC | PRN

## 2022-11-14 MED ORDER — METHYLPREDNISOLONE SODIUM SUCC 125 MG IJ SOLR: 125.0000 mg | Freq: Once | INTRAMUSCULAR | Status: DC | PRN

## 2022-11-14 MED ORDER — SODIUM CHLORIDE 0.9 % IV SOLN
200.0000 mg | Freq: Once | INTRAVENOUS | Status: AC
  Administered 2022-11-14: 200 mg via INTRAVENOUS
  Filled 2022-11-14: qty 10

## 2022-11-14 MED ORDER — SODIUM CHLORIDE 0.9 % IV SOLN: Freq: Once | INTRAVENOUS | Status: DC | PRN

## 2022-11-14 MED ORDER — ANTICOAGULANT SODIUM CITRATE 4% (200MG/5ML) IV SOLN: 5.0000 mL | Freq: Once | Status: DC | PRN

## 2022-11-14 MED ORDER — HEPARIN SOD (PORK) LOCK FLUSH 100 UNIT/ML IV SOLN: 500.0000 [IU] | Freq: Once | INTRAVENOUS | Status: DC | PRN

## 2022-11-14 MED ORDER — FAMOTIDINE IN NACL 20-0.9 MG/50ML-% IV SOLN: 20.0000 mg | Freq: Once | INTRAVENOUS | Status: DC | PRN

## 2022-11-14 NOTE — Progress Notes (Signed)
Diagnosis: Iron Deficiency Anemia  Provider:  Chilton Greathouse MD  Procedure: IV Infusion  IV Type: Peripheral, IV Location: L Antecubital  Venofer (Iron Sucrose), Dose: 200 mg  Infusion Start Time: 0935  Infusion Stop Time: 0952  Post Infusion IV Care: Patient declined observation and Peripheral IV Discontinued  Discharge: Condition: Good, Destination: Home . AVS Provided  Performed by:  Adriana Mccallum, RN

## 2022-11-21 ENCOUNTER — Ambulatory Visit (INDEPENDENT_AMBULATORY_CARE_PROVIDER_SITE_OTHER): Payer: 59

## 2022-11-21 VITALS — BP 162/86 | HR 81 | Temp 98.5°F | Resp 18 | Ht 66.0 in | Wt 184.0 lb

## 2022-11-21 DIAGNOSIS — D500 Iron deficiency anemia secondary to blood loss (chronic): Secondary | ICD-10-CM | POA: Diagnosis not present

## 2022-11-21 DIAGNOSIS — N920 Excessive and frequent menstruation with regular cycle: Secondary | ICD-10-CM | POA: Diagnosis not present

## 2022-11-21 MED ORDER — SODIUM CHLORIDE 0.9 % IV SOLN
200.0000 mg | Freq: Once | INTRAVENOUS | Status: AC
  Administered 2022-11-21: 200 mg via INTRAVENOUS
  Filled 2022-11-21: qty 10

## 2022-11-21 NOTE — Progress Notes (Signed)
Diagnosis: Iron Deficiency Anemia  Provider:  Chilton Greathouse MD  Procedure: IV Infusion  IV Type: Peripheral, IV Location: L Antecubital  Venofer (Iron Sucrose), Dose: 200 mg  Infusion Start Time: 0916  Infusion Stop Time: 0936  Post Infusion IV Care: Patient declined observation and Peripheral IV Discontinued  Discharge: Condition: Good, Destination: Home . AVS Declined  Performed by:  Wyvonne Lenz, RN

## 2022-11-28 ENCOUNTER — Encounter: Payer: Self-pay | Admitting: Nurse Practitioner

## 2022-11-28 ENCOUNTER — Ambulatory Visit (INDEPENDENT_AMBULATORY_CARE_PROVIDER_SITE_OTHER): Payer: 59 | Admitting: Nurse Practitioner

## 2022-11-28 VITALS — BP 122/82 | HR 76 | Temp 97.9°F | Ht 66.0 in | Wt 185.0 lb

## 2022-11-28 DIAGNOSIS — R739 Hyperglycemia, unspecified: Secondary | ICD-10-CM

## 2022-11-28 DIAGNOSIS — Z1231 Encounter for screening mammogram for malignant neoplasm of breast: Secondary | ICD-10-CM

## 2022-11-28 DIAGNOSIS — R232 Flushing: Secondary | ICD-10-CM | POA: Diagnosis not present

## 2022-11-28 DIAGNOSIS — Z136 Encounter for screening for cardiovascular disorders: Secondary | ICD-10-CM

## 2022-11-28 DIAGNOSIS — E559 Vitamin D deficiency, unspecified: Secondary | ICD-10-CM | POA: Diagnosis not present

## 2022-11-28 DIAGNOSIS — Z13220 Encounter for screening for lipoid disorders: Secondary | ICD-10-CM | POA: Diagnosis not present

## 2022-11-28 DIAGNOSIS — Z0001 Encounter for general adult medical examination with abnormal findings: Secondary | ICD-10-CM

## 2022-11-28 DIAGNOSIS — Z23 Encounter for immunization: Secondary | ICD-10-CM | POA: Diagnosis not present

## 2022-11-28 DIAGNOSIS — Z1211 Encounter for screening for malignant neoplasm of colon: Secondary | ICD-10-CM

## 2022-11-28 LAB — HEMOGLOBIN A1C: Hgb A1c MFr Bld: 5.8 % (ref 4.6–6.5)

## 2022-11-28 LAB — TSH: TSH: 2.18 u[IU]/mL (ref 0.35–5.50)

## 2022-11-28 LAB — LIPID PANEL
Cholesterol: 157 mg/dL (ref 0–200)
HDL: 63.1 mg/dL (ref >39.00)
LDL Cholesterol: 78 mg/dL (ref 0–99)
NonHDL: 94.27
Total CHOL/HDL Ratio: 2
Triglycerides: 82 mg/dL (ref 0.0–149.0)
VLDL: 16.4 mg/dL (ref 0.0–40.0)

## 2022-11-28 LAB — COMPREHENSIVE METABOLIC PANEL
ALT: 12 U/L (ref 0–35)
AST: 12 U/L (ref 0–37)
Albumin: 4.3 g/dL (ref 3.5–5.2)
Alkaline Phosphatase: 88 U/L (ref 39–117)
BUN: 19 mg/dL (ref 6–23)
CO2: 26 mEq/L (ref 19–32)
Calcium: 9.2 mg/dL (ref 8.4–10.5)
Chloride: 105 mEq/L (ref 96–112)
Creatinine, Ser: 0.91 mg/dL (ref 0.40–1.20)
GFR: 72.26 mL/min (ref >60.00)
Glucose, Bld: 99 mg/dL (ref 70–99)
Potassium: 4.5 mEq/L (ref 3.5–5.1)
Sodium: 139 mEq/L (ref 135–145)
Total Bilirubin: 0.3 mg/dL (ref 0.2–1.2)
Total Protein: 7.2 g/dL (ref 6.0–8.3)

## 2022-11-28 NOTE — Patient Instructions (Addendum)
Try estroven supplement for hot flashes. Call office for paxil prescription if no improvement in 64month. Go to lab Continue Heart healthy diet and daily exercise.  Preventive Care 9-53 Years Old, Female Preventive care refers to lifestyle choices and visits with your health care provider that can promote health and wellness. Preventive care visits are also called wellness exams. What can I expect for my preventive care visit? Counseling Your health care provider may ask you questions about your: Medical history, including: Past medical problems. Family medical history. Pregnancy history. Current health, including: Menstrual cycle. Method of birth control. Emotional well-being. Home life and relationship well-being. Sexual activity and sexual health. Lifestyle, including: Alcohol, nicotine or tobacco, and drug use. Access to firearms. Diet, exercise, and sleep habits. Work and work Astronomer. Sunscreen use. Safety issues such as seatbelt and bike helmet use. Physical exam Your health care provider will check your: Height and weight. These may be used to calculate your BMI (body mass index). BMI is a measurement that tells if you are at a healthy weight. Waist circumference. This measures the distance around your waistline. This measurement also tells if you are at a healthy weight and may help predict your risk of certain diseases, such as type 2 diabetes and high blood pressure. Heart rate and blood pressure. Body temperature. Skin for abnormal spots. What immunizations do I need?  Vaccines are usually given at various ages, according to a schedule. Your health care provider will recommend vaccines for you based on your age, medical history, and lifestyle or other factors, such as travel or where you work. What tests do I need? Screening Your health care provider may recommend screening tests for certain conditions. This may include: Lipid and cholesterol levels. Diabetes  screening. This is done by checking your blood sugar (glucose) after you have not eaten for a while (fasting). Pelvic exam and Pap test. Hepatitis B test. Hepatitis C test. HIV (human immunodeficiency virus) test. STI (sexually transmitted infection) testing, if you are at risk. Lung cancer screening. Colorectal cancer screening. Mammogram. Talk with your health care provider about when you should start having regular mammograms. This may depend on whether you have a family history of breast cancer. BRCA-related cancer screening. This may be done if you have a family history of breast, ovarian, tubal, or peritoneal cancers. Bone density scan. This is done to screen for osteoporosis. Talk with your health care provider about your test results, treatment options, and if necessary, the need for more tests. Follow these instructions at home: Eating and drinking  Eat a diet that includes fresh fruits and vegetables, whole grains, lean protein, and low-fat dairy products. Take vitamin and mineral supplements as recommended by your health care provider. Do not drink alcohol if: Your health care provider tells you not to drink. You are pregnant, may be pregnant, or are planning to become pregnant. If you drink alcohol: Limit how much you have to 0-1 drink a day. Know how much alcohol is in your drink. In the [place] one drink equals one 12 oz bottle of beer (355 mL), one 5 oz glass of wine (148 mL), or one 1 oz glass of hard liquor (44 mL). Lifestyle Brush your teeth every morning and night with fluoride toothpaste. Floss one time each day. Exercise for at least 30 minutes 5 or more days each week. Do not use any products that contain nicotine or tobacco. These products include cigarettes, chewing tobacco, and vaping devices, such as e-cigarettes. If you need help  quitting, ask your health care provider. Do not use drugs. If you are sexually active, practice safe sex. Use a condom or other form of  protection to prevent STIs. If you do not wish to become pregnant, use a form of birth control. If you plan to become pregnant, see your health care provider for a prepregnancy visit. Take aspirin only as told by your health care provider. Make sure that you understand how much to take and what form to take. Work with your health care provider to find out whether it is safe and beneficial for you to take aspirin daily. Find healthy ways to manage stress, such as: Meditation, yoga, or listening to music. Journaling. Talking to a trusted person. Spending time with friends and family. Minimize exposure to UV radiation to reduce your risk of skin cancer. Safety Always wear your seat belt while driving or riding in a vehicle. Do not drive: If you have been drinking alcohol. Do not ride with someone who has been drinking. When you are tired or distracted. While texting. If you have been using any mind-altering substances or drugs. Wear a helmet and other protective equipment during sports activities. If you have firearms in your house, make sure you follow all gun safety procedures. Seek help if you have been physically or sexually abused. What's next? Visit your health care provider once a year for an annual wellness visit. Ask your health care provider how often you should have your eyes and teeth checked. Stay up to date on all vaccines. This information is not intended to replace advice given to you by your health care provider. Make sure you discuss any questions you have with your health care provider. Document Revised: 08/22/2020 Document Reviewed: 08/22/2020 Elsevier Patient Education  2024 [employer].

## 2022-11-28 NOTE — Progress Notes (Signed)
Complete physical exam  Patient: Penny Schultz   DOB: [birth year]   52 y.o. Female  MRN: [id number] Visit Date: 11/28/2022  Subjective:    Chief Complaint  Patient presents with   Annual Exam    Fasting. Would like the cologuard. Declined mobile mammo, will go through OBGYN   Accompanied by mother.  Penny Schultz is a 52 y.o. female who presents today for a complete physical exam. She reports consuming a general diet.  No consistent exercise  She generally feels well. She reports sleeping well. She does have additional problems to discuss today.  Vision:No Dental:No STD Screen:No  We discussed pros and cons of cologuard vs colonoscopy. With Hx hx of colon polyps, advised to complete colonoscopy. She agreed.  BP Readings from Last 3 Encounters:  11/28/22 122/82  11/21/22 (!) 162/86  11/14/22 (!) 144/83   Wt Readings from Last 3 Encounters:  11/28/22 185 lb (83.9 kg)  11/21/22 184 lb (83.5 kg)  11/14/22 189 lb 3.2 oz (85.8 kg)   Most recent fall risk assessment:    11/28/2022    8:26 AM  Fall Risk   Falls in the past year? 0  Number falls in past yr: 0  Injury with Fall? 0   Depression screen:Yes - No Depression  Most recent depression screenings:    11/28/2022    8:27 AM 07/31/2021    8:55 AM  PHQ 2/9 Scores  PHQ - 2 Score 0 1  PHQ- 9 Score 3 4   HPI  Hot flashes onset 1year ago, Disrupted sleep due to hot flashes, also occurs in daytime but worse at night. Report menstrual cycle is irregular. She is not sexually active No improvement with black cohosh OTC Minimal ALCOHOL use: 1x/month-beer Caffeine: 1cup of coffee weekly. No consistent exercise regimen.  We discussed use of paxil vs effexor vs Estroven OVER THE COUNTER. She opted to use estroven. Check THYROID, and CMP  Past Medical History:  Diagnosis Date   Blood transfusion without reported diagnosis    Past Surgical History:  Procedure Laterality Date   LEEP  2007   open heart surgery 52 years  old     uterine ablation     Social History   Socioeconomic History   Marital status: Single    Spouse name: Not on file   Number of children: 0   Years of education: Not on file   Highest education level: Not on file  Occupational History   Not on file  Tobacco Use   Smoking status: Former    Current packs/day: 0.00    Types: Cigarettes    Quit date: 03/13/2015    Years since quitting: 7.7    Passive exposure: Never   Smokeless tobacco: Never  Vaping Use   Vaping status: Never Used  Substance and Sexual Activity   Alcohol use: Not Currently    Alcohol/week: 6.0 standard drinks of alcohol    Types: 2 Cans of beer, 4 Shots of liquor per week    Comment: daily   Drug use: Yes    Frequency: 7.0 times per week    Types: Marijuana   Sexual activity: Not Currently    Birth control/protection: None  Other Topics Concern   Not on file  Social History Narrative   Not on file   Social Determinants of Health   Financial Resource Strain: Not on file  Food Insecurity: Not on file  Transportation Needs: Not on file  Physical Activity: Not on  file  Stress: Not on file  Social Connections: Not on file  Intimate Partner Violence: Not on file   Family Status  Relation Name Status   Mother  Alive   Father  Deceased  No partnership data on file   Family History  Problem Relation Age of Onset   Diabetes Mother    Hypertension Mother    Colon polyps Mother    Diabetes Father    Heart disease Father    No Known Allergies  Patient Care Team: Joseguadalupe Stan, Bonna Gains, NP as PCP - General (Internal Medicine)   Medications: Outpatient Medications Prior to Visit  Medication Sig   Iron Polysacch Cmplx-B12-FA 150-0.025-1 MG CAPS Take 1 capsule by mouth daily at 12 noon. With food   Multiple Vitamin (MULTIVITAMIN WITH MINERALS) TABS tablet Take 1 tablet by mouth daily.   No facility-administered medications prior to visit.   Review of Systems  Constitutional:  Negative for  activity change, appetite change and unexpected weight change.  Respiratory: Negative.    Cardiovascular: Negative.   Gastrointestinal: Negative.   Endocrine: Negative for cold intolerance and heat intolerance.  Genitourinary: Negative.   Musculoskeletal: Negative.   Skin: Negative.   Neurological: Negative.   Hematological: Negative.   Psychiatric/Behavioral:  Negative for behavioral problems, decreased concentration, dysphoric mood, hallucinations, self-injury, sleep disturbance and suicidal ideas. The patient is not nervous/anxious.    Last CBC Lab Results  Component Value Date   WBC 4.8 10/10/2022   HGB 12.8 10/10/2022   HCT 39.1 10/10/2022   MCV 87.9 10/10/2022   MCH 28.8 10/10/2022   RDW 12.6 10/10/2022   PLT 311 10/10/2022      Objective:  BP 122/82 (BP Location: Left Arm, Patient Position: Sitting, Cuff Size: Large)   Pulse 76   Temp 97.9 F (36.6 C) (Temporal)   Ht 5\' 6"  (1.676 m)   Wt 185 lb (83.9 kg)   LMP  (LMP Unknown)   SpO2 99%   BMI 29.86 kg/m     Physical Exam Vitals and nursing note reviewed.  Constitutional:      General: She is not in acute distress. HENT:     Right Ear: Tympanic membrane, ear canal and external ear normal.     Left Ear: Tympanic membrane, ear canal and external ear normal.     Nose: Nose normal.  Eyes:     Extraocular Movements: Extraocular movements intact.     Conjunctiva/sclera: Conjunctivae normal.     Pupils: Pupils are equal, round, and reactive to light.  Neck:     Thyroid: No thyroid mass, thyromegaly or thyroid tenderness.  Cardiovascular:     Rate and Rhythm: Normal rate and regular rhythm.     Pulses: Normal pulses.     Heart sounds: Normal heart sounds.  Pulmonary:     Effort: Pulmonary effort is normal.     Breath sounds: Normal breath sounds.  Abdominal:     General: Bowel sounds are normal.     Palpations: Abdomen is soft.  Musculoskeletal:        General: Normal range of motion.     Cervical back:  Normal range of motion and neck supple.     Right lower leg: No edema.     Left lower leg: No edema.  Lymphadenopathy:     Cervical: No cervical adenopathy.  Skin:    General: Skin is warm and dry.  Neurological:     Mental Status: She is alert and oriented to person, place,  and time.     Cranial Nerves: No cranial nerve deficit.  Psychiatric:        Mood and Affect: Mood normal.        Behavior: Behavior normal.        Thought Content: Thought content normal.      No results found for any visits on 11/28/22.    Assessment & Plan:    Routine Health Maintenance and Physical Exam  Immunization History  Administered Date(s) Administered   Influenza, Seasonal, Injecte, Preservative Fre 11/28/2022   Influenza,inj,Quad PF,6+ Mos 05/26/2015, 12/11/2020   Influenza-Unspecified 12/06/2018   PFIZER(Purple Top)SARS-COV-2 Vaccination 06/09/2019, 07/04/2019, 03/12/2020   Tdap 12/13/2018   Zoster Recombinant(Shingrix) 12/11/2020, 07/31/2021   Health Maintenance  Topic Date Due   Colonoscopy  Never done   MAMMOGRAM  09/06/2021   COVID-19 Vaccine (4 - 2023-24 season) 12/14/2022 (Originally 11/09/2022)   Hepatitis C Screening  11/28/2023 (Originally 11/11/1987)   Cervical Cancer Screening (HPV/Pap Cotest)  08/30/2023   DTaP/Tdap/Td (2 - Td or Tdap) 12/12/2028   INFLUENZA VACCINE  Completed   HIV Screening  Completed   Zoster Vaccines- Shingrix  Completed   HPV VACCINES  Aged Out   Discussed health benefits of physical activity, and encouraged her to engage in regular exercise appropriate for her age and condition.  Problem List Items Addressed This Visit     Hot flashes    onset 1year ago, Disrupted sleep due to hot flashes, also occurs in daytime but worse at night. Report menstrual cycle is irregular. She is not sexually active No improvement with black cohosh OTC Minimal ALCOHOL use: 1x/month-beer Caffeine: 1cup of coffee weekly. No consistent exercise regimen.  We discussed  use of paxil vs effexor vs Estroven OVER THE COUNTER. She opted to use estroven. Check THYROID, and CMP      Relevant Orders   TSH   Hyperglycemia   Relevant Orders   Hemoglobin A1c   Vitamin D deficiency   Other Visit Diagnoses     Encounter for preventative adult health care exam with abnormal findings    -  Primary   Relevant Orders   Comprehensive metabolic panel   Immunization due       Relevant Orders   Flu vaccine trivalent PF, 6mos and older(Flulaval,Afluria,Fluarix,Fluzone) (Completed)   Breast cancer screening by mammogram       Relevant Orders   MM 3D SCREENING MAMMOGRAM BILATERAL BREAST   Encounter for lipid screening for cardiovascular disease       Relevant Orders   Lipid panel   Colon cancer screening       Relevant Orders   Ambulatory referral to Gastroenterology      Return in about 1 year (around 11/28/2023) for CPE (fasting).     Alysia Penna, NP

## 2022-11-28 NOTE — Assessment & Plan Note (Signed)
onset 1year ago, Disrupted sleep due to hot flashes, also occurs in daytime but worse at night. Report menstrual cycle is irregular. She is not sexually active No improvement with black cohosh OTC Minimal ALCOHOL use: 1x/month-beer Caffeine: 1cup of coffee weekly. No consistent exercise regimen.  We discussed use of paxil vs effexor vs Estroven OVER THE COUNTER. She opted to use estroven. Check THYROID, and CMP

## 2022-12-01 NOTE — Progress Notes (Signed)
Stable Follow instructions as discussed during office visit.

## 2022-12-12 ENCOUNTER — Encounter: Payer: Self-pay | Admitting: Nurse Practitioner

## 2022-12-12 ENCOUNTER — Other Ambulatory Visit: Payer: Self-pay | Admitting: Nurse Practitioner

## 2022-12-12 DIAGNOSIS — Z1212 Encounter for screening for malignant neoplasm of rectum: Secondary | ICD-10-CM

## 2022-12-12 DIAGNOSIS — Z1211 Encounter for screening for malignant neoplasm of colon: Secondary | ICD-10-CM

## 2023-01-01 ENCOUNTER — Ambulatory Visit: Payer: 59

## 2023-04-09 ENCOUNTER — Inpatient Hospital Stay: Payer: 59 | Admitting: Hematology and Oncology

## 2023-04-09 ENCOUNTER — Inpatient Hospital Stay: Payer: 59 | Attending: Hematology and Oncology

## 2023-04-09 VITALS — BP 141/90 | HR 69 | Temp 97.9°F | Resp 13 | Wt 181.5 lb

## 2023-04-09 DIAGNOSIS — N951 Menopausal and female climacteric states: Secondary | ICD-10-CM | POA: Insufficient documentation

## 2023-04-09 DIAGNOSIS — D500 Iron deficiency anemia secondary to blood loss (chronic): Secondary | ICD-10-CM

## 2023-04-09 DIAGNOSIS — Z87891 Personal history of nicotine dependence: Secondary | ICD-10-CM | POA: Insufficient documentation

## 2023-04-09 DIAGNOSIS — N920 Excessive and frequent menstruation with regular cycle: Secondary | ICD-10-CM | POA: Diagnosis present

## 2023-04-09 DIAGNOSIS — R232 Flushing: Secondary | ICD-10-CM | POA: Insufficient documentation

## 2023-04-09 LAB — CBC WITH DIFFERENTIAL (CANCER CENTER ONLY)
Abs Immature Granulocytes: 0.04 10*3/uL (ref 0.00–0.07)
Basophils Absolute: 0 10*3/uL (ref 0.0–0.1)
Basophils Relative: 0 %
Eosinophils Absolute: 0.1 10*3/uL (ref 0.0–0.5)
Eosinophils Relative: 1 %
HCT: 39.2 % (ref 36.0–46.0)
Hemoglobin: 12.8 g/dL (ref 12.0–15.0)
Immature Granulocytes: 1 %
Lymphocytes Relative: 46 %
Lymphs Abs: 2.5 10*3/uL (ref 0.7–4.0)
MCH: 29 pg (ref 26.0–34.0)
MCHC: 32.7 g/dL (ref 30.0–36.0)
MCV: 88.7 fL (ref 80.0–100.0)
Monocytes Absolute: 0.6 10*3/uL (ref 0.1–1.0)
Monocytes Relative: 10 %
Neutro Abs: 2.4 10*3/uL (ref 1.7–7.7)
Neutrophils Relative %: 42 %
Platelet Count: 275 10*3/uL (ref 150–400)
RBC: 4.42 MIL/uL (ref 3.87–5.11)
RDW: 12.3 % (ref 11.5–15.5)
WBC Count: 5.6 10*3/uL (ref 4.0–10.5)
nRBC: 0 % (ref 0.0–0.2)

## 2023-04-09 LAB — IRON AND IRON BINDING CAPACITY (CC-WL,HP ONLY)
Iron: 54 ug/dL (ref 28–170)
Saturation Ratios: 12 % (ref 10.4–31.8)
TIBC: 459 ug/dL — ABNORMAL HIGH (ref 250–450)
UIBC: 405 ug/dL (ref 148–442)

## 2023-04-09 LAB — FERRITIN: Ferritin: 34 ng/mL (ref 11–307)

## 2023-04-09 NOTE — Progress Notes (Signed)
Penny Schultz [facility] [phone]   [phone]  PROGRESS NOTE  Patient Care Team: Anne Ng, NP as PCP - General (Internal Medicine)  Hematological/Oncological History # Iron Deficiency Anemia in the Setting of GYN Bleeding 09/29/2008: WBC 7.6, Hgb 13.7, MCV 89.5, Plt 321 03/26/2015: WBC 7.1, Hgb 9.6, MCV 71.6, Plt 364 06/11/2021: WBC 6.5, Hgb 10.4, MCV 72.1, Plt 369. Iron 55, ferritin 4, iron sat 11%, TIBC 499.  06/28/2021: establish care with Dr. Leonides Schanz   5/12-08/16/2021: IV venofer 200 mg x 5 doses  Interval History:  Penny Schultz 53 y.o. female with medical history significant for iron deficiency anemia who presents for a follow up visit. The patient's last visit was on 09/27/2022. In the interim since the last visit she has continued on p.o iron therapy.   On exam today Penny Schultz reports she has been well overall in the room since her last visit.  She reports that her energy has been about a 10 out of 10.  She reports she is going through menopause now and is having sporadic cycles.  Some have been heavy and some have been light.  She reports he is also having some hot flashes and sweats for which she is taking black cohosh.  She reports that she is tolerating her iron pills well with no stomach upset.  She reports no lightheadedness, dizziness, shortness of breath.  She also had no infectious symptoms recently.  She reports that she is doing her best to try to drink juices and eat iron rich food.  She is also trying to eat from a list of anti-inflammatory foods to help with her arthritis.. She denies fevers, chills, sweats, shortness of breath, chest pain, cough, headaches or dizziness. She has no other complaints. Rest of the ROS is below.   MEDICAL HISTORY:  Past Medical History:  Diagnosis Date   Blood transfusion without reported diagnosis     SURGICAL HISTORY: Past Surgical History:  Procedure Laterality Date   LEEP  2007   open heart surgery  53 years old     uterine ablation      SOCIAL HISTORY: Social History   Socioeconomic History   Marital status: Single    Spouse name: Not on file   Number of children: 0   Years of education: Not on file   Highest education level: Not on file  Occupational History   Not on file  Tobacco Use   Smoking status: Former    Current packs/day: 0.00    Types: Cigarettes    Quit date: 03/13/2015    Years since quitting: 8.0    Passive exposure: Never   Smokeless tobacco: Never  Vaping Use   Vaping status: Never Used  Substance and Sexual Activity   Alcohol use: Not Currently    Alcohol/week: 6.0 standard drinks of alcohol    Types: 2 Cans of beer, 4 Shots of liquor per week    Comment: daily   Drug use: Yes    Frequency: 7.0 times per week    Types: Marijuana   Sexual activity: Not Currently    Birth control/protection: None  Other Topics Concern   Not on file  Social History Narrative   Not on file   Social Drivers of Health   Financial Resource Strain: Not on file  Food Insecurity: Not on file  Transportation Needs: Not on file  Physical Activity: Not on file  Stress: Not on file  Social Connections: Not on file  Intimate Partner Violence: Not on file    FAMILY HISTORY: Family History  Problem Relation Age of Onset   Diabetes Mother    Hypertension Mother    Colon polyps Mother    Diabetes Father    Heart disease Father     ALLERGIES:  has no known allergies.  MEDICATIONS:  Current Outpatient Medications  Medication Sig Dispense Refill   Iron Polysacch Cmplx-B12-FA 150-0.025-1 MG CAPS Take 1 capsule by mouth daily at 12 noon. With food 90 capsule 3   Multiple Vitamin (MULTIVITAMIN WITH MINERALS) TABS tablet Take 1 tablet by mouth daily.     No current facility-administered medications for this visit.    REVIEW OF SYSTEMS:   Constitutional: ( - ) fevers, ( - )  chills , ( - ) night sweats Eyes: ( - ) blurriness of vision, ( - ) double vision, ( - )  watery eyes Ears, nose, mouth, throat, and face: ( - ) mucositis, ( - ) sore throat Respiratory: ( - ) cough, ( - ) dyspnea, ( - ) wheezes Cardiovascular: ( - ) palpitation, ( - ) chest discomfort, ( - ) lower extremity swelling Gastrointestinal:  ( - ) nausea, ( - ) heartburn, ( - ) change in bowel habits Skin: ( - ) abnormal skin rashes Lymphatics: ( - ) new lymphadenopathy, ( - ) easy bruising Neurological: ( - ) numbness, ( - ) tingling, ( - ) new weaknesses Behavioral/Psych: ( - ) mood change, ( - ) new changes  All other systems were reviewed with the patient and are negative.  PHYSICAL EXAMINATION:  Vitals:   04/09/23 0835  BP: (!) 141/90  Pulse: 69  Resp: 13  Temp: 97.9 F (36.6 C)  SpO2: 100%    Filed Weights   04/09/23 0835  Weight: 181 lb 8 oz (82.3 kg)     GENERAL: well appearing middle-aged African-American female, alert, no distress and comfortable SKIN: skin color, texture, turgor are normal, no rashes or significant lesions EYES: conjunctiva are pink and non-injected, sclera clear LUNGS: clear to auscultation and percussion with normal breathing effort HEART: regular rate & rhythm and no murmurs and no lower extremity edema Musculoskeletal: no cyanosis of digits and no clubbing  PSYCH: alert & oriented x 3, fluent speech NEURO: no focal motor/sensory deficits  LABORATORY DATA:  I have reviewed the data as listed    Latest Ref Rng & Units 04/09/2023    8:12 AM 10/10/2022    7:52 AM 04/11/2022    8:04 AM  CBC  WBC 4.0 - 10.5 K/uL 5.6  4.8  5.6   Hemoglobin 12.0 - 15.0 g/dL 98.1  19.1  47.8   Hematocrit 36.0 - 46.0 % 39.2  39.1  38.2   Platelets 150 - 400 K/uL 275  311  279        Latest Ref Rng & Units 11/28/2022    9:01 AM 04/11/2022    8:04 AM 09/26/2021    7:54 AM  CMP  Glucose 70 - 99 mg/dL 99  90  295   BUN 6 - 23 mg/dL 19  17  19    Creatinine 0.40 - 1.20 mg/dL 6.21  3.08  6.57   Sodium 135 - 145 mEq/L 139  139  138   Potassium 3.5 - 5.1 mEq/L  4.5  4.1  4.0   Chloride 96 - 112 mEq/L 105  107  109   CO2 19 - 32 mEq/L 26  26  22  Calcium 8.4 - 10.5 mg/dL 9.2  9.2  9.5   Total Protein 6.0 - 8.3 g/dL 7.2  6.9  7.5   Total Bilirubin 0.2 - 1.2 mg/dL 0.3  0.3  0.3   Alkaline Phos 39 - 117 U/L 88  82  77   AST 0 - 37 U/L 12  13  15    ALT 0 - 35 U/L 12  11  14      RADIOGRAPHIC STUDIES: No results found.  ASSESSMENT & PLAN Penny Schultz is a 53 y.o. female with medical history significant for iron deficiency anemia who presents for a follow up visit.   # Iron Deficiency Anemia 2/2 to GYN Bleeding --Findings are consistent with iron deficiency anemia secondary to patient's menorrhagia --patient is s/p IV venofer x 5 doses on 5/12-08/16/2021 --Labs from today shows white blood cell 5.6, hemoglobin 12.8, MCV 88.7, platelets 275 -- Continue on oral iron supplementation as prescribed. --If iron levels are low would recommend pursuing IV iron therapy. --Plan for return to clinic in 6 months with labs at follow-up visit.  No orders of the defined types were placed in this encounter.   All questions were answered. The patient knows to call the clinic with any problems, questions or concerns.  I have spent a total of 25 minutes minutes of face-to-face and non-face-to-face time, preparing to see the patient,  performing a medically appropriate examination, counseling and educating the patient, documenting clinical information in the electronic health record, and care coordination.   Ulysees Barns, MD Department of Hematology/Oncology [facility] Phone: [phone] Pager: [phone] Email: [email or web address]  04/09/2023 2:06 PM

## 2023-09-17 ENCOUNTER — Telehealth: Payer: Self-pay | Admitting: Physician Assistant

## 2023-09-17 NOTE — Telephone Encounter (Signed)
 Rescheule appointment per provider pal.  Called left VM with changes made to the upcoming appointment.

## 2023-10-09 ENCOUNTER — Ambulatory Visit: Payer: 59 | Admitting: Physician Assistant

## 2023-10-09 ENCOUNTER — Other Ambulatory Visit: Payer: 59

## 2023-10-12 ENCOUNTER — Other Ambulatory Visit: Payer: Self-pay | Admitting: Physician Assistant

## 2023-10-12 DIAGNOSIS — D500 Iron deficiency anemia secondary to blood loss (chronic): Secondary | ICD-10-CM

## 2023-10-12 NOTE — Progress Notes (Unsigned)
 [facility] [phone]   [phone]  PROGRESS NOTE  Patient Care Team: Nche, Roselie Rockford, NP as PCP - General (Internal Medicine)  Hematological/Oncological History # Iron  Deficiency Anemia in the Setting of GYN Bleeding 09/29/2008: WBC 7.6, Hgb 13.7, MCV 89.5, Plt 321 03/26/2015: WBC 7.1, Hgb 9.6, MCV 71.6, Plt 364 06/11/2021: WBC 6.5, Hgb 10.4, MCV 72.1, Plt 369. Iron  55, ferritin 4, iron  sat 11%, TIBC 499.  06/28/2021: establish care with Dr. Federico   5/12-08/16/2021: IV venofer  200 mg x 5 doses 11/12/2022-11/28/2022: IV venofer  200 mg x 2 doses  Interval History:  Penny Schultz 53 y.o. female with medical history significant for iron  deficiency anemia who presents for a follow up visit. The patient's last visit was on 04/09/2023. In the interim since the last visit she has continued on p.o iron  therapy.   On exam today Penny Schultz reports she has been energy levels and continues to complete her ADLs on her own.  She has a good good appetite and has noted some weight gain approximately 9 pounds since January 2025.  She denies nausea, vomiting or bowel habit changes.  She denies easy bruising or signs of active bleeding.  She reports her menstrual cycles are periodic.  Her last cycle that was heavy was in May 2025.She denies fevers, chills, sweats, shortness of breath, chest pain, cough, headaches or dizziness. She has no other complaints. Rest of the ROS is below.   MEDICAL HISTORY:  Past Medical History:  Diagnosis Date   Blood transfusion without reported diagnosis     SURGICAL HISTORY: Past Surgical History:  Procedure Laterality Date   LEEP  2007   open heart surgery 53 years old     uterine ablation      SOCIAL HISTORY: Social History   Socioeconomic History   Marital status: Single    Spouse name: Not on file   Number of children: 0   Years of education: Not on file   Highest education level: Not on file  Occupational History   Not on  file  Tobacco Use   Smoking status: Former    Current packs/day: 0.00    Types: Cigarettes    Quit date: 03/13/2015    Years since quitting: 8.5    Passive exposure: Never   Smokeless tobacco: Never  Vaping Use   Vaping status: Never Used  Substance and Sexual Activity   Alcohol use: Not Currently    Alcohol/week: 6.0 standard drinks of alcohol    Types: 2 Cans of beer, 4 Shots of liquor per week    Comment: daily   Drug use: Yes    Frequency: 7.0 times per week    Types: Marijuana   Sexual activity: Not Currently    Birth control/protection: None  Other Topics Concern   Not on file  Social History Narrative   Not on file   Social Drivers of Health   Financial Resource Strain: Not on file  Food Insecurity: Not on file  Transportation Needs: Not on file  Physical Activity: Not on file  Stress: Not on file  Social Connections: Not on file  Intimate Partner Violence: Not on file    FAMILY HISTORY: Family History  Problem Relation Age of Onset   Diabetes Mother    Hypertension Mother    Colon polyps Mother    Diabetes Father    Heart disease Father     ALLERGIES:  has no known allergies.  MEDICATIONS:  Current Outpatient  Medications  Medication Sig Dispense Refill   Iron  Polysacch [id number] 150-0.025-1 MG CAPS Take 1 capsule by mouth daily at 12 noon. With food 90 capsule 3   Multiple Vitamin (MULTIVITAMIN WITH MINERALS) TABS tablet Take 1 tablet by mouth daily.     No current facility-administered medications for this visit.    REVIEW OF SYSTEMS:   Constitutional: ( - ) fevers, ( - )  chills , ( - ) night sweats Eyes: ( - ) blurriness of vision, ( - ) double vision, ( - ) watery eyes Ears, nose, mouth, throat, and face: ( - ) mucositis, ( - ) sore throat Respiratory: ( - ) cough, ( - ) dyspnea, ( - ) wheezes Cardiovascular: ( - ) palpitation, ( - ) chest discomfort, ( - ) lower extremity swelling Gastrointestinal:  ( - ) nausea, ( - ) heartburn, ( - )  change in bowel habits Skin: ( - ) abnormal skin rashes Lymphatics: ( - ) new lymphadenopathy, ( - ) easy bruising Neurological: ( - ) numbness, ( - ) tingling, ( - ) new weaknesses Behavioral/Psych: ( - ) mood change, ( - ) new changes  All other systems were reviewed with the patient and are negative.  PHYSICAL EXAMINATION:  Vitals:   10/14/23 0838  BP: 135/85  Pulse: 77  Resp: 18  Temp: 97.7 F (36.5 C)  SpO2: 100%    Filed Weights   10/14/23 0838  Weight: 190 lb 14.4 oz (86.6 kg)    GENERAL: well appearing middle-aged African-American female, alert, no distress and comfortable SKIN: skin color, texture, turgor are normal, no rashes or significant lesions EYES: conjunctiva are pink and non-injected, sclera clear LUNGS: clear to auscultation and percussion with normal breathing effort HEART: regular rate & rhythm and no murmurs and no lower extremity edema Musculoskeletal: no cyanosis of digits and no clubbing  PSYCH: alert & oriented x 3, fluent speech NEURO: no focal motor/sensory deficits  LABORATORY DATA:  I have reviewed the data as listed    Latest Ref Rng & Units 10/14/2023    8:07 AM 04/09/2023    8:12 AM 10/10/2022    7:52 AM  CBC  WBC 4.0 - 10.5 K/uL 4.6  5.6  4.8   Hemoglobin 12.0 - 15.0 g/dL 87.1  87.1  87.1   Hematocrit 36.0 - 46.0 % 38.4  39.2  39.1   Platelets 150 - 400 K/uL 269  275  311        Latest Ref Rng & Units 10/14/2023    8:07 AM 11/28/2022    9:01 AM 04/11/2022    8:04 AM  CMP  Glucose 70 - 99 mg/dL 896  99  90   BUN 6 - 20 mg/dL 17  19  17    Creatinine 0.44 - 1.00 mg/dL 9.17  9.08  9.20   Sodium 135 - 145 mmol/L 138  139  139   Potassium 3.5 - 5.1 mmol/L 4.2  4.5  4.1   Chloride 98 - 111 mmol/L 106  105  107   CO2 22 - 32 mmol/L 25  26  26    Calcium 8.9 - 10.3 mg/dL 9.1  9.2  9.2   Total Protein 6.5 - 8.1 g/dL 7.4  7.2  6.9   Total Bilirubin 0.0 - 1.2 mg/dL 0.4  0.3  0.3   Alkaline Phos 38 - 126 U/L 89  88  82   AST 15 - 41 U/L 18  12   13  ALT 0 - 44 U/L 15  12  11      RADIOGRAPHIC STUDIES: No results found.  ASSESSMENT & PLAN Penny Schultz is a 53 y.o. female with medical history significant for iron  deficiency anemia who presents for a follow up visit.   # Iron  Deficiency Anemia 2/2 to GYN Bleeding --Findings are consistent with iron  deficiency anemia secondary to patient's menorrhagia --patient is s/p IV venofer  x 2 doses from 11/14/2022-11/28/2022.  --Labs from today shows no evidence of anemia with Hgb 12.8, MCV 86.9. Iron  panel is pending.  -- Continue on oral iron  supplementation as prescribed. --If iron  levels are low would recommend pursuing IV iron  therapy. --Plan for return to clinic in 6 months with labs at follow-up visit.  No orders of the defined types were placed in this encounter.   All questions were answered. The patient knows to call the clinic with any problems, questions or concerns.  I have spent a total of 25 minutes minutes of face-to-face and non-face-to-face time, preparing to see the patient,  performing a medically appropriate examination, counseling and educating the patient, documenting clinical information in the electronic health record, and care coordination.   Johnston Police PA-C Dept of Hematology and Oncology [facility] Phone: [phone]   10/14/2023 8:50 AM

## 2023-10-14 ENCOUNTER — Inpatient Hospital Stay: Attending: Physician Assistant

## 2023-10-14 ENCOUNTER — Inpatient Hospital Stay: Admitting: Physician Assistant

## 2023-10-14 VITALS — BP 135/85 | HR 77 | Temp 97.7°F | Resp 18 | Ht 66.0 in | Wt 190.9 lb

## 2023-10-14 DIAGNOSIS — N920 Excessive and frequent menstruation with regular cycle: Secondary | ICD-10-CM | POA: Insufficient documentation

## 2023-10-14 DIAGNOSIS — D500 Iron deficiency anemia secondary to blood loss (chronic): Secondary | ICD-10-CM | POA: Insufficient documentation

## 2023-10-14 DIAGNOSIS — N951 Menopausal and female climacteric states: Secondary | ICD-10-CM | POA: Diagnosis not present

## 2023-10-14 DIAGNOSIS — Z87891 Personal history of nicotine dependence: Secondary | ICD-10-CM | POA: Insufficient documentation

## 2023-10-14 LAB — CBC WITH DIFFERENTIAL (CANCER CENTER ONLY)
Abs Immature Granulocytes: 0 K/uL (ref 0.00–0.07)
Basophils Absolute: 0 K/uL (ref 0.0–0.1)
Basophils Relative: 1 %
Eosinophils Absolute: 0.1 K/uL (ref 0.0–0.5)
Eosinophils Relative: 2 %
HCT: 38.4 % (ref 36.0–46.0)
Hemoglobin: 12.8 g/dL (ref 12.0–15.0)
Immature Granulocytes: 0 %
Lymphocytes Relative: 47 %
Lymphs Abs: 2.2 K/uL (ref 0.7–4.0)
MCH: 29 pg (ref 26.0–34.0)
MCHC: 33.3 g/dL (ref 30.0–36.0)
MCV: 86.9 fL (ref 80.0–100.0)
Monocytes Absolute: 0.4 K/uL (ref 0.1–1.0)
Monocytes Relative: 9 %
Neutro Abs: 1.9 K/uL (ref 1.7–7.7)
Neutrophils Relative %: 41 %
Platelet Count: 269 K/uL (ref 150–400)
RBC: 4.42 MIL/uL (ref 3.87–5.11)
RDW: 12.9 % (ref 11.5–15.5)
WBC Count: 4.6 K/uL (ref 4.0–10.5)
nRBC: 0 % (ref 0.0–0.2)

## 2023-10-14 LAB — CMP (CANCER CENTER ONLY)
ALT: 15 U/L (ref 0–44)
AST: 18 U/L (ref 15–41)
Albumin: 4.2 g/dL (ref 3.5–5.0)
Alkaline Phosphatase: 89 U/L (ref 38–126)
Anion gap: 7 (ref 5–15)
BUN: 17 mg/dL (ref 6–20)
CO2: 25 mmol/L (ref 22–32)
Calcium: 9.1 mg/dL (ref 8.9–10.3)
Chloride: 106 mmol/L (ref 98–111)
Creatinine: 0.82 mg/dL (ref 0.44–1.00)
GFR, Estimated: >60 mL/min (ref >60)
Glucose, Bld: 103 mg/dL — ABNORMAL HIGH (ref 70–99)
Potassium: 4.2 mmol/L (ref 3.5–5.1)
Sodium: 138 mmol/L (ref 135–145)
Total Bilirubin: 0.4 mg/dL (ref 0.0–1.2)
Total Protein: 7.4 g/dL (ref 6.5–8.1)

## 2023-10-14 LAB — IRON AND IRON BINDING CAPACITY (CC-WL,HP ONLY)
Iron: 81 ug/dL (ref 28–170)
Saturation Ratios: 18 % (ref 10.4–31.8)
TIBC: 444 ug/dL (ref 250–450)
UIBC: 363 ug/dL (ref 148–442)

## 2023-10-14 LAB — FERRITIN: Ferritin: 45 ng/mL (ref 11–307)

## 2023-10-15 ENCOUNTER — Ambulatory Visit: Payer: Self-pay

## 2023-10-15 NOTE — Telephone Encounter (Signed)
-----   Message from [place] Police sent at 10/14/2023 11:10 PM EDT ----- Please notify patient iron  levels are normal. No need for IV iron  at this time.  ----- Message ----- From: Rebecka, [facility] [place] Sent: 10/14/2023   8:17 AM EDT To: [place] Police, PA-C

## 2023-10-15 NOTE — Telephone Encounter (Signed)
 Pt advised and voiced understanding.

## 2023-10-16 ENCOUNTER — Other Ambulatory Visit: Payer: Self-pay | Admitting: Physician Assistant

## 2023-10-16 DIAGNOSIS — D500 Iron deficiency anemia secondary to blood loss (chronic): Secondary | ICD-10-CM

## 2023-11-23 ENCOUNTER — Ambulatory Visit
Admission: RE | Admit: 2023-11-23 | Discharge: 2023-11-23 | Disposition: A | Discharge status: Home / Self Care | Source: Ambulatory Visit | Attending: Nurse Practitioner | Admitting: Nurse Practitioner

## 2023-11-23 DIAGNOSIS — Z1231 Encounter for screening mammogram for malignant neoplasm of breast: Secondary | ICD-10-CM

## 2024-04-14 ENCOUNTER — Telehealth: Payer: Self-pay | Admitting: Physician Assistant

## 2024-04-14 NOTE — Telephone Encounter (Signed)
 Pt called to reschedule appt due to roads

## 2024-04-15 ENCOUNTER — Inpatient Hospital Stay

## 2024-04-15 ENCOUNTER — Inpatient Hospital Stay: Admitting: Physician Assistant

## 2024-05-06 ENCOUNTER — Inpatient Hospital Stay

## 2024-05-06 ENCOUNTER — Inpatient Hospital Stay: Admitting: Physician Assistant
